# Patient Record
Sex: Female | Born: 1965 | Race: White | Hispanic: No | Marital: Married | Smoking: Never smoker
Health system: Southern US, Community
[De-identification: ages and names within clinical notes are randomized; demographics above are authoritative.]

## PROBLEM LIST (undated history)

## (undated) DIAGNOSIS — Z8719 Personal history of other diseases of the digestive system: Secondary | ICD-10-CM

## (undated) DIAGNOSIS — O24419 Gestational diabetes mellitus in pregnancy, unspecified control: Secondary | ICD-10-CM

## (undated) HISTORY — DX: Personal history of other diseases of the digestive system: Z87.19

## (undated) HISTORY — DX: Gestational diabetes mellitus in pregnancy, unspecified control: O24.419

## (undated) HISTORY — PX: CHOLECYSTECTOMY: SHX55

---

## 2000-10-06 ENCOUNTER — Other Ambulatory Visit: Admission: RE | Admit: 2000-10-06 | Discharge: 2000-10-06 | Payer: Self-pay | Admitting: *Deleted

## 2001-04-18 ENCOUNTER — Other Ambulatory Visit: Admission: RE | Admit: 2001-04-18 | Discharge: 2001-04-18 | Payer: Self-pay | Admitting: *Deleted

## 2001-11-08 ENCOUNTER — Other Ambulatory Visit: Admission: RE | Admit: 2001-11-08 | Discharge: 2001-11-08 | Payer: Self-pay | Admitting: *Deleted

## 2001-12-20 ENCOUNTER — Other Ambulatory Visit: Admission: RE | Admit: 2001-12-20 | Discharge: 2001-12-20 | Payer: Self-pay | Admitting: Obstetrics and Gynecology

## 2002-07-05 ENCOUNTER — Other Ambulatory Visit: Admission: RE | Admit: 2002-07-05 | Discharge: 2002-07-05 | Payer: Self-pay | Admitting: Obstetrics and Gynecology

## 2002-12-26 ENCOUNTER — Other Ambulatory Visit: Admission: RE | Admit: 2002-12-26 | Discharge: 2002-12-26 | Payer: Self-pay | Admitting: Obstetrics and Gynecology

## 2003-05-14 ENCOUNTER — Other Ambulatory Visit: Admission: RE | Admit: 2003-05-14 | Discharge: 2003-05-14 | Payer: Self-pay | Admitting: Obstetrics and Gynecology

## 2003-08-29 ENCOUNTER — Other Ambulatory Visit: Admission: RE | Admit: 2003-08-29 | Discharge: 2003-08-29 | Payer: Self-pay | Admitting: Obstetrics and Gynecology

## 2004-05-05 ENCOUNTER — Other Ambulatory Visit: Admission: RE | Admit: 2004-05-05 | Discharge: 2004-05-05 | Payer: Self-pay | Admitting: Obstetrics and Gynecology

## 2004-08-17 ENCOUNTER — Encounter: Admission: RE | Admit: 2004-08-17 | Discharge: 2004-08-17 | Payer: Self-pay | Admitting: Obstetrics and Gynecology

## 2004-11-04 ENCOUNTER — Other Ambulatory Visit: Admission: RE | Admit: 2004-11-04 | Discharge: 2004-11-04 | Payer: Self-pay | Admitting: Obstetrics and Gynecology

## 2005-01-12 ENCOUNTER — Observation Stay (HOSPITAL_COMMUNITY): Admission: EM | Admit: 2005-01-12 | Discharge: 2005-01-13 | Payer: Self-pay | Admitting: *Deleted

## 2005-02-25 ENCOUNTER — Encounter: Admission: RE | Admit: 2005-02-25 | Discharge: 2005-02-25 | Payer: Self-pay | Admitting: Obstetrics and Gynecology

## 2005-05-05 ENCOUNTER — Other Ambulatory Visit: Admission: RE | Admit: 2005-05-05 | Discharge: 2005-05-05 | Payer: Self-pay | Admitting: Obstetrics and Gynecology

## 2005-08-24 ENCOUNTER — Encounter: Admission: RE | Admit: 2005-08-24 | Discharge: 2005-08-24 | Payer: Self-pay | Admitting: Obstetrics and Gynecology

## 2006-01-11 ENCOUNTER — Other Ambulatory Visit: Admission: RE | Admit: 2006-01-11 | Discharge: 2006-01-11 | Payer: Self-pay | Admitting: Obstetrics and Gynecology

## 2006-02-28 ENCOUNTER — Encounter: Admission: RE | Admit: 2006-02-28 | Discharge: 2006-02-28 | Payer: Self-pay | Admitting: Obstetrics and Gynecology

## 2006-05-17 ENCOUNTER — Other Ambulatory Visit: Admission: RE | Admit: 2006-05-17 | Discharge: 2006-05-17 | Payer: Self-pay | Admitting: Obstetrics and Gynecology

## 2006-09-26 ENCOUNTER — Encounter: Admission: RE | Admit: 2006-09-26 | Discharge: 2006-09-26 | Payer: Self-pay | Admitting: Obstetrics and Gynecology

## 2007-04-20 ENCOUNTER — Encounter: Admission: RE | Admit: 2007-04-20 | Discharge: 2007-04-20 | Payer: Self-pay | Admitting: Obstetrics and Gynecology

## 2007-12-07 ENCOUNTER — Encounter: Admission: RE | Admit: 2007-12-07 | Discharge: 2007-12-07 | Payer: Self-pay | Admitting: Obstetrics and Gynecology

## 2009-12-10 ENCOUNTER — Encounter: Admission: RE | Admit: 2009-12-10 | Discharge: 2009-12-10 | Payer: Self-pay | Admitting: Obstetrics and Gynecology

## 2010-11-22 ENCOUNTER — Encounter: Payer: Self-pay | Admitting: Obstetrics and Gynecology

## 2011-03-19 NOTE — H&P (Signed)
Wolf, Diane                  ACCOUNT NO.:  [id number]   MEDICAL RECORD NO.:  [id number]          PATIENT TYPE:  INP   LOCATION:  A328                          FACILITY:  [facility]   PHYSICIAN:  Ky Barban, M.D.DATE OF BIRTH:  02-13-1966   DATE OF ADMISSION:  01/12/2005  DATE OF DISCHARGE:  LH                                HISTORY & PHYSICAL   CHIEF COMPLAINT:  Recurrent right flank pain.   HISTORY:  This 45 year old female presenting to the emergency room this  morning with severe pain in her right lower quadrant, did have nausea and  vomiting, no fever, chills or any voiding complaint.  CT scan shows 2 mm  stone in the right ureterovesical junction.  Patient was having considerable  pain so she is admitted for control of pain and further management.   PAST HISTORY:  She denies any other significant medical problem.  No  diabetes or hypertension.   ALLERGIES:  IVP DYE.   FAMILY HISTORY:  Father had kidney stones.   PERSONAL HISTORY:  Does not smoke or drink.   REVIEW OF SYSTEMS:  Unremarkable.   EXAMINATION:  Blood pressure 130/80, temperature is normal.  Fully  conscious, alert, oriented and not in acute distress.  Central nervous  system is negative.  Head/neck/eye, ENT:  Negative.  Cardiovascular:  Regular sinus rhythm.  Abdomen is soft/flat, liver/spleen/kidneys are  nonpalpable, deep tenderness right lower quadrant.  Pelvic exam is deferred.  Extremities are normal.   IMPRESSION:  Right ureteral calculus.   PLAN:  Admit.  IV fluids.  Parenteral analgesia.  Strain her urine.      MIJ/MEDQ  D:  01/12/2005  T:  01/12/2005  Job:  [id number]

## 2011-05-06 ENCOUNTER — Other Ambulatory Visit: Payer: Self-pay | Admitting: Obstetrics and Gynecology

## 2011-05-06 DIAGNOSIS — Z1231 Encounter for screening mammogram for malignant neoplasm of breast: Secondary | ICD-10-CM

## 2011-05-13 ENCOUNTER — Ambulatory Visit: Payer: Self-pay

## 2011-05-20 ENCOUNTER — Ambulatory Visit
Admission: RE | Admit: 2011-05-20 | Discharge: 2011-05-20 | Disposition: A | Discharge status: Home / Self Care | Payer: 59 | Source: Ambulatory Visit | Attending: Obstetrics and Gynecology | Admitting: Obstetrics and Gynecology

## 2011-05-20 DIAGNOSIS — Z1231 Encounter for screening mammogram for malignant neoplasm of breast: Secondary | ICD-10-CM

## 2012-04-10 ENCOUNTER — Other Ambulatory Visit: Payer: Self-pay | Admitting: Obstetrics and Gynecology

## 2012-04-10 DIAGNOSIS — Z1231 Encounter for screening mammogram for malignant neoplasm of breast: Secondary | ICD-10-CM

## 2012-05-31 ENCOUNTER — Ambulatory Visit
Admission: RE | Admit: 2012-05-31 | Discharge: 2012-05-31 | Disposition: A | Discharge status: Home / Self Care | Payer: No Typology Code available for payment source | Source: Ambulatory Visit | Attending: Obstetrics and Gynecology | Admitting: Obstetrics and Gynecology

## 2012-05-31 DIAGNOSIS — Z1231 Encounter for screening mammogram for malignant neoplasm of breast: Secondary | ICD-10-CM

## 2013-04-25 ENCOUNTER — Other Ambulatory Visit: Payer: Self-pay

## 2013-04-25 DIAGNOSIS — Z1231 Encounter for screening mammogram for malignant neoplasm of breast: Secondary | ICD-10-CM

## 2013-06-01 ENCOUNTER — Ambulatory Visit: Payer: No Typology Code available for payment source

## 2014-02-05 ENCOUNTER — Ambulatory Visit: Payer: No Typology Code available for payment source

## 2014-02-07 ENCOUNTER — Other Ambulatory Visit: Payer: Self-pay

## 2014-02-07 ENCOUNTER — Ambulatory Visit
Admission: RE | Admit: 2014-02-07 | Discharge: 2014-02-07 | Disposition: A | Discharge status: Home / Self Care | Payer: No Typology Code available for payment source | Source: Ambulatory Visit

## 2014-02-07 DIAGNOSIS — Z1231 Encounter for screening mammogram for malignant neoplasm of breast: Secondary | ICD-10-CM

## 2014-10-20 ENCOUNTER — Emergency Department (HOSPITAL_COMMUNITY)
Admission: EM | Admit: 2014-10-20 | Discharge: 2014-10-20 | Disposition: A | Discharge status: Home / Self Care | Payer: No Typology Code available for payment source | Attending: Emergency Medicine | Admitting: Emergency Medicine

## 2014-10-20 ENCOUNTER — Encounter (HOSPITAL_COMMUNITY): Payer: Self-pay | Admitting: Emergency Medicine

## 2014-10-20 DIAGNOSIS — R55 Syncope and collapse: Secondary | ICD-10-CM | POA: Diagnosis not present

## 2014-10-20 DIAGNOSIS — R61 Generalized hyperhidrosis: Secondary | ICD-10-CM | POA: Insufficient documentation

## 2014-10-20 LAB — I-STAT CHEM 8, ED
BUN: 13 mg/dL (ref 6–23)
Calcium, Ion: 1.21 mmol/L (ref 1.12–1.23)
Chloride: 102 mEq/L (ref 96–112)
Creatinine, Ser: 1 mg/dL (ref 0.50–1.10)
Glucose, Bld: 123 mg/dL — ABNORMAL HIGH (ref 70–99)
HCT: 40 % (ref 36.0–46.0)
Hemoglobin: 13.6 g/dL (ref 12.0–15.0)
Potassium: 3.2 mEq/L — ABNORMAL LOW (ref 3.7–5.3)
Sodium: 140 mEq/L (ref 137–147)
TCO2: 23 mmol/L (ref 0–100)

## 2014-10-20 NOTE — ED Notes (Signed)
Discharge instructions given, pt demonstrated teach back and verbal understanding. No concerns voiced.  

## 2014-10-20 NOTE — Discharge Instructions (Signed)
Drink plenty of fluids.  Recheck as needed.

## 2014-10-20 NOTE — ED Notes (Signed)
Patient was visiting with mother in ED and had syncopal episode.  Patient states she began to feel hot and sweaty and syncope was witnessed by ED tech.  Patient states she feels tingly all over.

## 2014-10-20 NOTE — ED Provider Notes (Signed)
CSN: [id number]     Arrival date & time 10/20/14  0006 History  This chart was scribed for Ward GivensIva L Jasmine Mcbeth, MD by Roxy Cedarhandni Bhalodia, ED Scribe. This patient was seen in room APA06/APA06 and the patient's care was started at 12:35 AM.   Chief Complaint  Patient presents with  . Near Syncope   Patient is a 48 y.o. female presenting with near-syncope. The history is provided by the patient. No language interpreter was used.  Near Syncope Associated symptoms include headaches.   HPI Comments: Diane Wolf is a 48 y.o. female with a history of heart murmur, cholecystectomy, who presents to the Emergency Department complaining of near syncope episode with associated diaphoresis and nausea. Patient was here for her mother who was in [facility] ED being seen for MVC. She states that she was texting her sister after standing for a while and having her head bent over when she had sudden onset of diaphoresis, nausea, and headache. She felt like she was going to have a syncopal episode. Per nurse tech in the room with her she did have a syncopal spell and was placed in a chair. No fall or trauma.  She was feeling weak and shaky, with tingling in her toes. She does not feel like she is going to have a syncopal episode anymore. She denies associated chest pain. She states she has previously had a syncopal episode over 20 years ago. She states she feels lightheaded whenever she leans over or squats for most of her adult life. She states all the females in her family have those symptoms. She states both she and her mother have heart murmurs that are not significant.  She states she saw a cardiologist about 15 years ago when she was having palpitations and she was told it was something benign. She was told she could be treated or she could not take medication.  PCP Dr Leandrew KoyanagiBurdine  History reviewed. No pertinent past medical history. Past Surgical History  Procedure Laterality Date  . Cholecystectomy     No family history  on file. History  Substance Use Topics  . Smoking status: Never Smoker   . Smokeless tobacco: Not on file  . Alcohol Use: No   OB History    No data available     Review of Systems  Constitutional: Positive for diaphoresis.  Cardiovascular: Positive for near-syncope.  Neurological: Positive for syncope (near syncope) and headaches.  All other systems reviewed and are negative.  Allergies  Contrast media  Home Medications   Prior to Admission medications   Not on File   Triage Vitals: BP 118/89 mmHg  Pulse 74  Temp(Src) 97.8 F (36.6 C) (Oral)  Ht 5\' 5"  (1.651 m)  Wt 150 lb (68.04 kg)  BMI 24.96 kg/m2  SpO2 100%  LMP 01/11/2011  Physical Exam  Constitutional: She is oriented to person, place, and time. She appears well-developed and well-nourished.  Non-toxic appearance. She does not appear ill. No distress.  HENT:  Head: Normocephalic and atraumatic.  Right Ear: External ear normal.  Left Ear: External ear normal.  Nose: Nose normal. No mucosal edema or rhinorrhea.  Mouth/Throat: Oropharynx is clear and moist and mucous membranes are normal. No dental abscesses or uvula swelling.  Eyes: Conjunctivae and EOM are normal. Pupils are equal, round, and reactive to light.  Neck: Normal range of motion and full passive range of motion without pain. Neck supple.  Cardiovascular: Normal rate, regular rhythm and normal heart sounds.  Exam  reveals no gallop and no friction rub.   No murmur heard. Pulmonary/Chest: Effort normal and breath sounds normal. No respiratory distress. She has no wheezes. She has no rhonchi. She has no rales. She exhibits no tenderness and no crepitus.  Abdominal: Soft. Normal appearance and bowel sounds are normal. She exhibits no distension. There is no tenderness. There is no rebound and no guarding.  Musculoskeletal: Normal range of motion. She exhibits no edema or tenderness.  Moves all extremities well.   Neurological: She is alert and oriented  to person, place, and time. She has normal strength. No cranial nerve deficit.  Skin: Skin is warm, dry and intact. No rash noted. No erythema. No pallor.  Psychiatric: She has a normal mood and affect. Her speech is normal and behavior is normal. Her mood appears not anxious.  Nursing note and vitals reviewed.  ED Course  Procedures (including critical care time)  Medications - No data to display  DIAGNOSTIC STUDIES: Oxygen Saturation is 100% on RA, normal by my interpretation.    COORDINATION OF CARE: 12:39 AM- Discussed plans to order diagnostic EKG and lab work. Pt advised of plan for treatment and pt agrees.  Patient was given oral fluids and she felt improved. Patient was told her potassium was low and to eat more foods high in potassium.   Orthostatic Vital Signs Orthostatic Lying  - BP- Lying: 106/42 mmHg ; Pulse- Lying: 76  Orthostatic Sitting - BP- Sitting: 116/68 mmHg ; Pulse- Sitting: 70  Orthostatic Standing at 0 minutes - BP- Standing at 0 minutes: 103/61 mmHg      Labs Review Results for orders placed or performed during the hospital encounter of 10/20/14  I-stat Chem 8, ED  Result Value Ref Range   Sodium 140 137 - 147 mEq/L   Potassium 3.2 (L) 3.7 - 5.3 mEq/L   Chloride 102 96 - 112 mEq/L   BUN 13 6 - 23 mg/dL   Creatinine, Ser 9.521.00 0.50 - 1.10 mg/dL   Glucose, Bld 841123 (H) 70 - 99 mg/dL   Calcium, Ion 3.241.21 4.011.12 - 1.23 mmol/L   TCO2 23 0 - 100 mmol/L   Hemoglobin 13.6 12.0 - 15.0 g/dL   HCT 02.740.0 25.336.0 - 66.446.0 %    Laboratory interpretation all normal except hypokalemia    Imaging Review No results found.   EKG Interpretation   Date/Time:  Sunday October 20 2014 00:13:02 EST Ventricular Rate:  76 PR Interval:  161 QRS Duration: 92 QT Interval:  403 QTC Calculation: 453 R Axis:   10 Text Interpretation:  Sinus rhythm Baseline wander Otherwise within normal  limits No old tracing to compare Confirmed by Donie Moulton  MD-I, Santana Edell ([id number]) on  10/20/2014  1:51:44 AM     MDM   Final diagnoses:  Vasovagal syncope    Plan discharge  Devoria AlbeIva Tambi Thole, MD, FACEP   I personally performed the services described in this documentation, which was scribed in my presence. The recorded information has been reviewed and considered.  Devoria AlbeIva Rakisha Pincock, MD, FACEP    Ward GivensIva L Seher Schlagel, MD 10/20/14 [phone]

## 2016-04-29 ENCOUNTER — Other Ambulatory Visit: Payer: Self-pay | Admitting: Obstetrics and Gynecology

## 2016-04-29 DIAGNOSIS — Z1231 Encounter for screening mammogram for malignant neoplasm of breast: Secondary | ICD-10-CM

## 2016-05-18 ENCOUNTER — Ambulatory Visit
Admission: RE | Admit: 2016-05-18 | Discharge: 2016-05-18 | Disposition: A | Discharge status: Home / Self Care | Payer: 59 | Source: Ambulatory Visit | Attending: Obstetrics and Gynecology | Admitting: Obstetrics and Gynecology

## 2016-05-18 DIAGNOSIS — Z1231 Encounter for screening mammogram for malignant neoplasm of breast: Secondary | ICD-10-CM

## 2017-04-28 ENCOUNTER — Other Ambulatory Visit: Payer: Self-pay | Admitting: Obstetrics and Gynecology

## 2017-04-28 DIAGNOSIS — Z1231 Encounter for screening mammogram for malignant neoplasm of breast: Secondary | ICD-10-CM

## 2017-05-19 ENCOUNTER — Ambulatory Visit
Admission: RE | Admit: 2017-05-19 | Discharge: 2017-05-19 | Disposition: A | Discharge status: Home / Self Care | Payer: 59 | Source: Ambulatory Visit | Attending: Obstetrics and Gynecology | Admitting: Obstetrics and Gynecology

## 2017-05-19 DIAGNOSIS — Z1231 Encounter for screening mammogram for malignant neoplasm of breast: Secondary | ICD-10-CM

## 2017-09-16 ENCOUNTER — Encounter (HOSPITAL_COMMUNITY): Payer: Self-pay | Admitting: Physical Therapy

## 2017-09-16 ENCOUNTER — Other Ambulatory Visit: Payer: Self-pay

## 2017-09-16 ENCOUNTER — Ambulatory Visit (HOSPITAL_COMMUNITY): Payer: Worker's Compensation | Attending: Specialist | Admitting: Physical Therapy

## 2017-09-16 DIAGNOSIS — M25511 Pain in right shoulder: Secondary | ICD-10-CM

## 2017-09-16 DIAGNOSIS — M25611 Stiffness of right shoulder, not elsewhere classified: Secondary | ICD-10-CM | POA: Insufficient documentation

## 2017-09-16 NOTE — Therapy (Signed)
[facility] [address] [place], [place], [address] Phone: [phone]   Fax:  [phone]  Physical Therapy Evaluation  Patient Details  Name: Diane Wolf MRN: [id number] Date of Birth: [birth year] Referring Provider: Eugenia Wolf   Encounter Date: 09/16/2017  PT End of Session - 09/16/17 1610    Visit Number  1    Number of Visits  12    Date for PT Re-Evaluation  10/16/17    Authorization Type  UHC    Authorization - Visit Number  1    Authorization - Number of Visits  10    PT Start Time  1525    PT Stop Time  1605    PT Time Calculation (min)  40 min    Activity Tolerance  Patient tolerated treatment well    Behavior During Therapy  [facility] for tasks assessed/performed       History reviewed. No pertinent past medical history.  Past Surgical History:  Procedure Laterality Date  . CHOLECYSTECTOMY      There were no vitals filed for this visit.   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  She had chiropractic treatment which helped, however, recently, a ball was thrown at her and when she quickly lifted her arm up to protect herself from getting hit she had an acute episode of pain.  She went  to the MD who was given predinsone and a cortisone shot which has improved her pain approximately 80% better.  She is no referred her to physical therapy.    Limitations  Lifting;House hold activities    Patient Stated Goals  no pain with activity     Currently in Pain?  No/denies 7/10 is the worst     Pain Score  0-No pain    Pain Location  Shoulder    Pain Orientation  Left    Pain Descriptors / Indicators  Aching;Sharp;Restless    Pain Type  Acute pain;Chronic pain    Pain Onset  More than a month ago    Pain Frequency  Intermittent    Aggravating Factors   lifting    Pain Relieving  Factors  rest          [facility] PT Assessment - 09/16/17 0001      Assessment   Medical Diagnosis  Rt shoulder impingement    Referring Provider  Diane Wolf    Onset Date/Surgical Date  01/13/17    Next MD Visit  10/13/2017    Prior Therapy  none      Precautions   Precautions  None      Restrictions   Weight Bearing Restrictions  No      Balance Screen   Has the patient fallen in the past 6 months  No    Has the patient had a decrease in activity level because of a fear of falling?   No    Is the patient reluctant to leave their home because of a fear of falling?   No      Prior Function   Level of Independence  Independent      Cognition   Overall Cognitive Status  Within Functional Limits for tasks assessed      Observation/Other Assessments   Focus on Therapeutic Outcomes (FOTO)   60  ROM / Strength   AROM / PROM / Strength  AROM;Strength      AROM   Overall AROM Comments  supine     AROM Assessment Site  Shoulder    Right/Left Shoulder  Right    Right Shoulder Flexion  120 Degrees    Right Shoulder ABduction  60 Degrees    Right Shoulder Internal Rotation  22 Degrees    Right Shoulder External Rotation  18 Degrees      Strength   Strength Assessment Site  Shoulder    Right/Left Shoulder  Right    Right Shoulder Flexion  4/5    Right Shoulder Extension  4-/5    Right Shoulder ABduction  4/5    Right Shoulder Internal Rotation  4+/5    Right Shoulder External Rotation  3+/5    Right Shoulder Horizontal ABduction  5/5    Right Shoulder Horizontal ADduction  5/5             Objective measurements completed on examination: See above findings.      [facility] Adult PT Treatment/Exercise - 09/16/17 0001      Exercises   Exercises  Shoulder      Shoulder Exercises: Supine   External Rotation  Right;10 reps    External Rotation Weight (lbs)  1    Internal Rotation  10 reps    Internal Rotation Weight (lbs)  1    Flexion  AAROM;Both;10 reps     ABduction  AAROM;Right;10 reps      Manual Therapy   Manual Therapy  Joint mobilization;Passive ROM    Manual therapy comments  done seperate from all other aspects of treatment     Joint Mobilization  ant, posterior and inferior glides     Passive ROM  all              PT Education - 09/16/17 1610    Education provided  Yes    Education Details  HEP    Person(s) Educated  Patient    Methods  Explanation;Handout       PT Short Term Goals - 09/16/17 1619      PT SHORT TERM GOAL #1   Title  Pt Rt UE motion to improve to allow pt to easily wash and style her hair.    Time  3    Period  Weeks    Status  New    Target Date  10/07/17      PT SHORT TERM GOAL #2   Title  PT pain to be no greater than a 4/10 to allow pt to have a full night sleep     Time  3    Period  Weeks    Status  New      PT SHORT TERM GOAL #3   Title  Pt to be able to reach up to her seatbelt and buckle it without difficulty     Time  3    Period  Weeks    Status  New      PT SHORT TERM GOAL #4   Title  Pt to be completing HEP in order to increase the motion of her right arm to allow the above to occur.        PT Long Term Goals - 09/16/17 1621      PT LONG TERM GOAL #1   Title  Pt Rt shoulder  ROM to have improved to allow pt to reach into her back pocket  without difficulty     Time  6    Period  Weeks    Status  New    Target Date  10/28/17      PT LONG TERM GOAL #2   Title  Pt Rt shoulder  ROM to have improved to allow pt to clasp her bra without difficulty     Time  6    Period  Weeks    Status  New      PT LONG TERM GOAL #3   Title  PT Rt shoulder ROM and strength to be increased to where pt can put her dishes up into higher cabinets without difficulty     Time  6    Period  Weeks    Status  New      PT LONG TERM GOAL #4   Title  Pt pain to be no greater than a 1/10 throughout the day 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     Time  6     Period  Weeks    Status  New             Plan - 09/16/17 1611    Clinical Impression Statement  Diane Wolf is a 51 yo female who has had two injuries to her Rt shoulder in the past seven months causing an impingement with adhesive capsulitis.  She has had an injection and been placed on prednisone which has imporved her pain significantly.  She is now being referred to skilled physical therapy to gain her ROM and strength.  Evaluation demonstrates significant weakness in the external rotators with mild weakness in all other mm.  All motions are limited significantly with joint capsule adhesion.  Diane Wolf will benefit from skilled physical therapy for exercises, PROM,  and joint mobilization.    Clinical Presentation  Stable    Clinical Decision Making  Low    Rehab Potential  Good    PT Frequency  2x / week    PT Duration  6 weeks    PT Treatment/Interventions  ADLs/Self Care Home Management;Cryotherapy;Ultrasound;Iontophoresis 4mg /ml Dexamethasone;Therapeutic activities;Therapeutic exercise;Patient/family education;Manual techniques;Passive range of motion    PT Next Visit Plan  begin pulley exercises, table flexion and abduction, wall walking for flexion, abduction and ER.  Continue with PROM and joint mobilizations.      PT Home Exercise Plan  eval: wand flexion, abduction, IR/ER.  Self joint capsule stretch for posterior, inferior and anterior capsule.        Patient will benefit from skilled therapeutic intervention in order to improve the following deficits and impairments:  Decreased activity tolerance, Decreased range of motion, Decreased strength, Impaired flexibility, Impaired UE functional use, Pain  Visit Diagnosis: Stiffness of right shoulder, not elsewhere classified - Plan: PT plan of care cert/re-cert  Acute pain of right shoulder - Plan: PT plan of care cert/re-cert     Problem List There are no active problems to display for this patient. Virgina OrganCynthia Debar Plate, PT  CLT [phone] 09/16/2017, 4:29 PM  [facility] [address] [place], [place], [address] Phone: [phone]   Fax:  [phone]  Name: Diane Wolf MRN: [id number] Date of Birth: [birth year]

## 2017-09-16 NOTE — Patient Instructions (Addendum)
ROM: Flexion - Wand (Supine)    Lie on back holding wand. Raise arms over head.  Repeat _10___ times per set. Do 1____ sets per session. Do _2__ sessions per day.  http://orth.exer.us/928   Copyright  VHI. All rights reserved.  ROM: Abduction - Wand    Holding wand with right  hand palm up, push wand directly out to side, leading with other hand palm up, until stretch is felt. Hold _5-10___ seconds. Repeat _10___ times per set. Do _1___ sets per session. Do _2___ sessions per day.  http://orth.exer.us/746   Copyright  VHI. All rights reserved.  ROM: External Rotation - Wand (Supine)    Lie on back holding a can with elbows bent to 90. Rotate forearms over head as far as possible. Then back to your hip. (pillow to hip ) Repeat __10__ times per set. Do __1__ sets per session. Do 2____ sessions per day.  http://orth.exer.us/932   Copyright  VHI. All rights reserved.  ROM: Posterior Capsule Stretch    Gently pull on right  forward elbow with other hand until stretch is felt in shoulder. Hold _15___ seconds. Repeat _3___ times per set. Do __1__ sets per session. Do __2__ sessions per day.  http://orth.exer.us/886   Copyright  VHI. All rights reserved.  ROM: Anterior Glide    Lean body weight between arms until stretch is felt. Hold __60__ seconds. Repeat __1__ times per set. Do ___1_ sets per session. Do ___2_ sessions per day.  http://orth.exer.us/770   Copyright  VHI. All rights reserved.  ROM: Inferior Glide    With towel under right  arm, gently pull arm toward floor until stretch is felt. Hold 30___ seconds. Repeat _5___ times per set. Do ___1_ sets per session. Do ___2_ sessions per day.  http://orth.exer.us/774   Copyright  VHI. All rights reserved.

## 2017-09-21 ENCOUNTER — Encounter (HOSPITAL_COMMUNITY): Payer: Self-pay

## 2017-09-21 ENCOUNTER — Ambulatory Visit (HOSPITAL_COMMUNITY): Payer: Worker's Compensation

## 2017-09-21 DIAGNOSIS — M25611 Stiffness of right shoulder, not elsewhere classified: Secondary | ICD-10-CM

## 2017-09-21 DIAGNOSIS — M25511 Pain in right shoulder: Secondary | ICD-10-CM

## 2017-09-21 NOTE — Therapy (Addendum)
[facility] [address] [place], [place], [address] Phone: [phone]   Fax:  [phone]  Physical Therapy Treatment  Patient Details  Name: Diane Wolf MRN: [id number] Date of Birth: [birth year] Referring Provider: Eugenia Mcalpineobert Collins   Encounter Date: 09/21/2017  PT End of Session - 09/21/17 1441    Visit Number  2    Number of Visits  12    Date for PT Re-Evaluation  10/16/17    Authorization Type  UHC    Authorization - Visit Number  2    Authorization - Number of Visits  10    PT Start Time  1432    PT Stop Time  1511    PT Time Calculation (min)  39 min    Activity Tolerance  Patient tolerated treatment well;No increased pain    Behavior During Therapy  [facility] for tasks assessed/performed       History reviewed. No pertinent past medical history.  Past Surgical History:  Procedure Laterality Date  . CHOLECYSTECTOMY      There were no vitals filed for this visit.  Subjective Assessment - 09/21/17 1436    Subjective  Pt stated she is feeling good today, not in any pain.  Reports compliance with HEP with minimal questions with form    Patient Stated Goals  no pain with activity     Currently in Pain?  No/denies           [facility] Adult PT Treatment/Exercise - 09/21/17 0001      Shoulder Exercises: Supine   External Rotation  Right;10 reps    External Rotation Weight (lbs)  wand    Internal Rotation  10 reps    Internal Rotation Weight (lbs)  wand    Flexion  AAROM;Both;10 reps wand    ABduction  AAROM;Right;10 reps wand      Shoulder Exercises: Seated   External Rotation  10 reps wand    Flexion  10 reps table slides    Abduction  10 reps table slides with 5" holds at end range      Shoulder Exercises: Pulleys   Flexion  2 minutes cueing for mechanics and form    ABduction  2 minutes cueing for mechanics and form         prone on elbow  PROM all directions and joint mobs ant, posterior and inferior  glides       PT Short Term Goals - 09/16/17 1619      PT SHORT TERM GOAL #1   Title  Pt Rt UE motion to improve to allow pt to easily wash and style her hair.    Time  3    Period  Weeks    Status  New    Target Date  10/07/17      PT SHORT TERM GOAL #2   Title  PT pain to be no greater than a 4/10 to allow pt to have a full night sleep     Time  3    Period  Weeks    Status  New      PT SHORT TERM GOAL #3   Title  Pt to be able to reach up to her seatbelt and buckle it without difficulty     Time  3    Period  Weeks    Status  New      PT SHORT TERM GOAL #4   Title  Pt to be completing HEP in  order to increase the motion of her right arm to allow the above to occur.        PT Long Term Goals - 09/16/17 1621      PT LONG TERM GOAL #1   Title  Pt Rt shoulder  ROM to have improved to allow pt to reach into her back pocket without difficulty     Time  6    Period  Weeks    Status  New    Target Date  10/28/17      PT LONG TERM GOAL #2   Title  Pt Rt shoulder  ROM to have improved to allow pt to clasp her bra without difficulty     Time  6    Period  Weeks    Status  New      PT LONG TERM GOAL #3   Title  PT Rt shoulder ROM and strength to be increased to where pt can put her dishes up into higher cabinets without difficulty     Time  6    Period  Weeks    Status  New      PT LONG TERM GOAL #4   Title  Pt pain to be no greater than a 1/10 throughout the day 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     Time  6    Period  Weeks    Status  New            Plan - 09/21/17 1515    Clinical Impression Statement  Reviewed goals, assured compliance and proper form with HEP and copy of eval given to pt.  Session focus on shoulder mobility with additonal exercises to address mobility.  Therapist facilitation with cueing to reduce compensation with side bending and shoulder elevation wiht fleixon and abduction.  No reports of pain  through session.      Rehab Potential  Good    PT Frequency  2x / week    PT Duration  6 weeks    PT Treatment/Interventions  ADLs/Self Care Home Management;Cryotherapy;Ultrasound;Iontophoresis 4mg /ml Dexamethasone;Therapeutic activities;Therapeutic exercise;Patient/family education;Manual techniques;Passive range of motion    PT Next Visit Plan  Add ball exercises for shoulder mobiltiy.  Continues wiht pullies, table slides and wall walking.  Continue with PROM and joint mobilizations.      PT Home Exercise Plan  eval: wand flexion, abduction, IR/ER.  Self joint capsule stretch for posterior, inferior and anterior capsule.        Patient will benefit from skilled therapeutic intervention in order to improve the following deficits and impairments:  Decreased activity tolerance, Decreased range of motion, Decreased strength, Impaired flexibility, Impaired UE functional use, Pain  Visit Diagnosis: Stiffness of right shoulder, not elsewhere classified  Acute pain of right shoulder     Problem List There are no active problems to display for this patient.  [address]Kynslee Baham, LPTA; CBIS [phone]  Juel BurrowCockerham, Andraya Frigon Jo 09/21/2017, 3:18 PM  [facility] [address] , [place], [address] Phone: [phone]   Fax:  [phone]  Name: Diane Wolf MRN: [id number] Date of Birth: 06/25/1966

## 2017-09-27 ENCOUNTER — Encounter (HOSPITAL_COMMUNITY): Payer: Self-pay

## 2017-09-27 ENCOUNTER — Ambulatory Visit (HOSPITAL_COMMUNITY): Payer: Worker's Compensation

## 2017-09-27 DIAGNOSIS — M25611 Stiffness of right shoulder, not elsewhere classified: Secondary | ICD-10-CM | POA: Diagnosis not present

## 2017-09-27 DIAGNOSIS — M25511 Pain in right shoulder: Secondary | ICD-10-CM

## 2017-09-27 NOTE — Therapy (Signed)
[facility] [address] [place], [place], [address] Phone: [phone]   Fax:  [phone]  Physical Therapy Treatment  Patient Details  Name: Diane Wolf MRN: [id number] Date of Birth: Sep 14, 1966 Referring Provider: Eugenia Mcalpineobert Collins   Encounter Date: 09/27/2017  PT End of Session - 09/27/17 1526    Visit Number  3    Number of Visits  12    Date for PT Re-Evaluation  10/16/17    Authorization Type  UHC    Authorization - Visit Number  3    Authorization - Number of Visits  10    PT Start Time  1517    PT Stop Time  1603    PT Time Calculation (min)  46 min    Activity Tolerance  Patient tolerated treatment well;No increased pain    Behavior During Therapy  [facility]       History reviewed. No pertinent past medical history.  Past Surgical History:  Procedure Laterality Date  . CHOLECYSTECTOMY      There were no vitals filed for this visit.  Subjective Assessment - 09/27/17 1522    Subjective  Pt stated her shoulder is stiff today.  Reports she has been compliant with HEP without questions.  Pt stated she has back pain with shoulder movements,. received massage yesterday.      Patient Stated Goals  no pain with activity     Currently in Pain?  Yes    Pain Score  2     Pain Location  Shoulder    Pain Orientation  Right    Pain Descriptors / Indicators  Tightness    Pain Type  Acute pain;Chronic pain    Pain Onset  More than a month ago    Pain Frequency  Intermittent    Aggravating Factors   lifting    Pain Relieving Factors  rest                      [facility] Adult PT Treatment/Exercise - 09/27/17 0001      Shoulder Exercises: Seated   External Rotation  10 reps    Flexion  10 reps table slides    Abduction  10 reps table slides      Shoulder Exercises: Pulleys   Flexion  2 minutes cueing for posture, mechanics and form    ABduction  2 minutes cueing for posture, mechanics and  form      Shoulder Exercises: Therapy Ball   Flexion  10 reps    ABduction  10 reps    Right/Left  10 reps      Shoulder Exercises: Stretch   Other Shoulder Stretches  childs pose 2x 30"    Other Shoulder Stretches  POE x 2min      Manual Therapy   Manual Therapy  Joint mobilization;Passive ROM    Manual therapy comments  done seperate from all other aspects of treatment     Joint Mobilization  ant, posterior and inferior glides     Passive ROM  all                PT Short Term Goals - 09/16/17 1619      PT SHORT TERM GOAL #1   Title  Pt Rt UE motion to improve to allow pt to easily wash and style her hair.    Time  3    Period  Weeks    Status  New    Target Date  10/07/17      PT SHORT TERM GOAL #2   Title  PT pain to be no greater than a 4/10 to allow pt to have a full night sleep     Time  3    Period  Weeks    Status  New      PT SHORT TERM GOAL #3   Title  Pt to be able to reach up to her seatbelt and buckle it without difficulty     Time  3    Period  Weeks    Status  New      PT SHORT TERM GOAL #4   Title  Pt to be completing HEP in order to increase the motion of her right arm to allow the above to occur.        PT Long Term Goals - 09/16/17 1621      PT LONG TERM GOAL #1   Title  Pt Rt shoulder  ROM to have improved to allow pt to reach into her back pocket without difficulty     Time  6    Period  Weeks    Status  New    Target Date  10/28/17      PT LONG TERM GOAL #2   Title  Pt Rt shoulder  ROM to have improved to allow pt to clasp her bra without difficulty     Time  6    Period  Weeks    Status  New      PT LONG TERM GOAL #3   Title  PT Rt shoulder ROM and strength to be increased to where pt can put her dishes up into higher cabinets without difficulty     Time  6    Period  Weeks    Status  New      PT LONG TERM GOAL #4   Title  Pt pain to be no greater than a 1/10 throughout the day 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     Time  6    Period  Weeks    Status  New            Plan - 09/27/17 1749    Clinical Impression Statement  Session focus on shoulder mobility.  Added theraband for flexion, abduction and IR/ER and child's pose for flexion and to address tightness in back for pain control.  Manual PROM complete per pt tolerance and joint mobs to improve mobilty.  Positive results with distractions during PROM.  No reports of increased pain through session.      Rehab Potential  Good    PT Frequency  2x / week    PT Duration  6 weeks    PT Treatment/Interventions  ADLs/Self Care Home Management;Cryotherapy;Ultrasound;Iontophoresis 4mg /ml Dexamethasone;Therapeutic activities;Therapeutic exercise;Patient/family education;Manual techniques;Passive range of motion    PT Next Visit Plan  Primarly shoulder mobiltiy with therex and manual PROM/joint mobs.      PT Home Exercise Plan  eval: wand flexion, abduction, IR/ER.  Self joint capsule stretch for posterior, inferior and anterior capsule.        Patient will benefit from skilled therapeutic intervention in order to improve the following deficits and impairments:  Decreased activity tolerance, Decreased range of motion, Decreased strength, Impaired flexibility, Impaired UE functional use, Pain  Visit Diagnosis: Stiffness of right shoulder, not elsewhere classified  Acute pain of right shoulder  Problem List There are no active problems to display for this patient.  [address]Shreyan Hinz, LPTA; CBIS [phone].  Juel BurrowCockerham, Mumin Denomme Jo 09/27/2017, 5:54 PM  [facility] [address] [place], [place], [address] Phone: [phone]   Fax:  [phone]  Name: Diane Wolf MRN: [id number] Date of Birth: 1966/05/06

## 2017-09-27 NOTE — Patient Instructions (Signed)
BACK: Child's Pose (Sciatica)    Sit in knee-chest position and reach arms forward. Separate knees for comfort. Hold position for 30 breaths. Repeat 3 times. Do 2 times per day.  Copyright  VHI. All rights reserved.   

## 2017-09-29 ENCOUNTER — Ambulatory Visit (HOSPITAL_COMMUNITY): Payer: Worker's Compensation | Admitting: Physical Therapy

## 2017-09-29 DIAGNOSIS — M25611 Stiffness of right shoulder, not elsewhere classified: Secondary | ICD-10-CM | POA: Diagnosis not present

## 2017-09-29 DIAGNOSIS — M25511 Pain in right shoulder: Secondary | ICD-10-CM

## 2017-09-29 NOTE — Therapy (Signed)
[facility] [address] [place], [place], [address] Phone: [phone]   Fax:  [phone]  Physical Therapy Treatment  Patient Details  Name: Diane Wolf MRN: [id number] Date of Birth: 1966/03/08 Referring Provider: Eugenia Mcalpine   Encounter Date: 09/29/2017  PT End of Session - 09/29/17 1709    Visit Number  4    Number of Visits  12    Date for PT Re-Evaluation  10/16/17    Authorization Type  UHC    Authorization - Visit Number  4    Authorization - Number of Visits  10    PT Start Time  1608    PT Stop Time  1700    PT Time Calculation (min)  52 min    Activity Tolerance  Patient tolerated treatment well;No increased pain    Behavior During Therapy  [facility] for tasks assessed/performed       No past medical history on file.  Past Surgical History:  Procedure Laterality Date  . CHOLECYSTECTOMY      There were no vitals filed for this visit.  Subjective Assessment - 09/29/17 1611    Subjective  PT states she aggrevated her shoulder yesterday when she she was at [employer] and an item was slipping out of her buggy causing her to reach quickly for it.  STates she had difficulty sleeping last night due to discomfort.  Currently it does not hurt today.    Currently in Pain?  No/denies         [facility] PT Assessment - 09/29/17 0001      Assessment   Medical Diagnosis  Rt shoulder impingement      AROM   Overall AROM Comments  supine     AROM Assessment Site  Shoulder    Right/Left Shoulder  Right    Right Shoulder Flexion  133 Degrees was 120    Right Shoulder ABduction  90 Degrees was 60    Right Shoulder Internal Rotation  40 Degrees was 22    Right Shoulder External Rotation  30 Degrees was 18                  [facility] Adult PT Treatment/Exercise - 09/29/17 0001      Shoulder Exercises: Supine   External Rotation  PROM    Internal Rotation  PROM    Flexion  PROM    ABduction  PROM      Shoulder  Exercises: Seated   External Rotation  10 reps    Flexion  10 reps    Abduction  10 reps      Shoulder Exercises: Standing   Other Standing Exercises  corner stretch 2X20"    Other Standing Exercises  GTB retraction 15 reps      Shoulder Exercises: Pulleys   Flexion  2 minutes    ABduction  2 minutes      Shoulder Exercises: Therapy Ball   Flexion  10 reps    ABduction  10 reps    Right/Left  10 reps      Shoulder Exercises: ROM/Strengthening   UBE (Upper Arm Bike)  3 minutes backward L1      Manual Therapy   Manual Therapy  Joint mobilization;Passive ROM    Manual therapy comments  done seperate from all other aspects of treatment     Joint Mobilization  ant, posterior and inferior glides     Passive ROM  all  PT Short Term Goals - 09/16/17 1619      PT SHORT TERM GOAL #1   Title  Pt Rt UE motion to improve to allow pt to easily wash and style her hair.    Time  3    Period  Weeks    Status  New    Target Date  10/07/17      PT SHORT TERM GOAL #2   Title  PT pain to be no greater than a 4/10 to allow pt to have a full night sleep     Time  3    Period  Weeks    Status  New      PT SHORT TERM GOAL #3   Title  Pt to be able to reach up to her seatbelt and buckle it without difficulty     Time  3    Period  Weeks    Status  New      PT SHORT TERM GOAL #4   Title  Pt to be completing HEP in order to increase the motion of her right arm to allow the above to occur.        PT Long Term Goals - 09/16/17 1621      PT LONG TERM GOAL #1   Title  Pt Rt shoulder  ROM to have improved to allow pt to reach into her back pocket without difficulty     Time  6    Period  Weeks    Status  New    Target Date  10/28/17      PT LONG TERM GOAL #2   Title  Pt Rt shoulder  ROM to have improved to allow pt to clasp her bra without difficulty     Time  6    Period  Weeks    Status  New      PT LONG TERM GOAL #3   Title  PT Rt shoulder ROM and  strength to be increased to where pt can put her dishes up into higher cabinets without difficulty     Time  6    Period  Weeks    Status  New      PT LONG TERM GOAL #4   Title  Pt pain to be no greater than a 1/10 throughout the day 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     Time  6    Period  Weeks    Status  New            Plan - 09/29/17 1709    Clinical Impression Statement  Continued with primary focus on shoulder ROM. Noted improvement as compared to last measurements on 11/16 in all planes of motion 12-30 degrees.  NOted tightness in upper trap region, which pateint reports she is going to get a massage next week.  Added scapular strengthening with theraband and instructed with corner pec stretch and ER doorway stretch.  Pt reported some soreness at EOS but still painfree.      Rehab Potential  Good    PT Frequency  2x / week    PT Duration  6 weeks    PT Treatment/Interventions  ADLs/Self Care Home Management;Cryotherapy;Ultrasound;Iontophoresis 4mg /ml Dexamethasone;Therapeutic activities;Therapeutic exercise;Patient/family education;Manual techniques;Passive range of motion    PT Next Visit Plan  Primarly shoulder mobiltiy with therex and manual PROM/joint mobs.   Educate on posture/postural strengthening.      PT Home Exercise Plan  eval: wand flexion, abduction, IR/ER.  Self joint capsule stretch for posterior, inferior and anterior capsule.        Patient will benefit from skilled therapeutic intervention in order to improve the following deficits and impairments:  Decreased activity tolerance, Decreased range of motion, Decreased strength, Impaired flexibility, Impaired UE functional use, Pain  Visit Diagnosis: Stiffness of right shoulder, not elsewhere classified  Acute pain of right shoulder     Problem List There are no active problems to display for this patient.  Lurena Nidamy B Hurshel Bouillon, PTA/CLT [phone]  Lurena NidaFrazier, Harvest  B 09/29/2017, 5:14 PM  [facility] [address] [place], [place], [address] Phone: [phone]   Fax:  [phone]  Name: Diane Wolf MRN: [id number] Date of Birth: [birth year]

## 2017-10-04 ENCOUNTER — Ambulatory Visit (HOSPITAL_COMMUNITY): Payer: Worker's Compensation | Attending: Specialist | Admitting: Physical Therapy

## 2017-10-04 DIAGNOSIS — M25511 Pain in right shoulder: Secondary | ICD-10-CM

## 2017-10-04 DIAGNOSIS — M25611 Stiffness of right shoulder, not elsewhere classified: Secondary | ICD-10-CM | POA: Insufficient documentation

## 2017-10-04 NOTE — Therapy (Signed)
[facility] [address] [place], [place], [address] Phone: [phone]   Fax:  [phone]  Physical Therapy Treatment  Patient Details  Name: Diane Wolf MRN: [id number] Date of Birth: 02/02/1966 Referring Provider: Eugenia Mcalpine   Encounter Date: 10/04/2017  PT End of Session - 10/04/17 1715    Visit Number  5    Number of Visits  12    Date for PT Re-Evaluation  10/16/17    Authorization Type  UHC    Authorization - Visit Number  5    Authorization - Number of Visits  10    PT Start Time  1605    PT Stop Time  1655    PT Time Calculation (min)  50 min    Activity Tolerance  Patient tolerated treatment well;No increased pain    Behavior During Therapy  [facility] for tasks assessed/performed       No past medical history on file.  Past Surgical History:  Procedure Laterality Date  . CHOLECYSTECTOMY      There were no vitals filed for this visit.  Subjective Assessment - 10/04/17 1637    Subjective  Pt states she is sore today and unsure why.  STates it is hard to get comfortable at night due to pain and discomfort.  STates MRI only shows her shoulder is really frozen but no tears or dysfunctions.    Currently in Pain?  No/denies                      [facility] Adult PT Treatment/Exercise - 10/04/17 0001      Shoulder Exercises: Supine   External Rotation  PROM    Internal Rotation  PROM    Flexion  PROM    ABduction  PROM      Shoulder Exercises: Seated   External Rotation  15 reps    Flexion  15 reps    Abduction  15 reps      Shoulder Exercises: Sidelying   ABduction  Right;10 reps      Shoulder Exercises: Standing   Other Standing Exercises  corner stretch 2X20", ER doorway  stretch 2X20"       Shoulder Exercises: Pulleys   Flexion  2 minutes    ABduction  2 minutes      Shoulder Exercises: Therapy Ball   Flexion  10 reps    ABduction  10 reps    Right/Left  10 reps      Shoulder Exercises:  ROM/Strengthening   UBE (Upper Arm Bike)  3 minutes backward L1      Manual Therapy   Manual Therapy  Passive ROM;Myofascial release    Manual therapy comments  done seperate from all other aspects of treatment     Joint Mobilization  --    Myofascial Release  to tight fascia in UE and into scapular region with active stretch    Passive ROM  all                PT Short Term Goals - 09/16/17 1619      PT SHORT TERM GOAL #1   Title  Pt Rt UE motion to improve to allow pt to easily wash and style her hair.    Time  3    Period  Weeks    Status  New    Target Date  10/07/17      PT SHORT TERM GOAL #2   Title  PT pain to be no greater than a 4/10 to allow pt to have a full night sleep     Time  3    Period  Weeks    Status  New      PT SHORT TERM GOAL #3   Title  Pt to be able to reach up to her seatbelt and buckle it without difficulty     Time  3    Period  Weeks    Status  New      PT SHORT TERM GOAL #4   Title  Pt to be completing HEP in order to increase the motion of her right arm to allow the above to occur.        PT Long Term Goals - 09/16/17 1621      PT LONG TERM GOAL #1   Title  Pt Rt shoulder  ROM to have improved to allow pt to reach into her back pocket without difficulty     Time  6    Period  Weeks    Status  New    Target Date  10/28/17      PT LONG TERM GOAL #2   Title  Pt Rt shoulder  ROM to have improved to allow pt to clasp her bra without difficulty     Time  6    Period  Weeks    Status  New      PT LONG TERM GOAL #3   Title  PT Rt shoulder ROM and strength to be increased to where pt can put her dishes up into higher cabinets without difficulty     Time  6    Period  Weeks    Status  New      PT LONG TERM GOAL #4   Title  Pt pain to be no greater than a 1/10 throughout the day 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     Time  6    Period  Weeks    Status  New            Plan - 10/04/17  1716    Clinical Impression Statement  Continued with focus on improving shoulder ROM (RT>Lt).  Manual assist to keep accessory muscles from completing motion and decreasing spasm and irritation.  Pt also c/o lumbar stress /spasm when getting towards end ROM in shoulders.  Pt is to get a soft tissue massage tomorrow for her scap and lumbar spine to help relieve this tightness.  Completed myofascial techniques during PROM to help release tightness and distract pain.  Noted improvement with added techniques with each stretch.  Pt reported overall improvment and less restriction following session.      Rehab Potential  Good    PT Frequency  2x / week    PT Duration  6 weeks    PT Treatment/Interventions  ADLs/Self Care Home Management;Cryotherapy;Ultrasound;Iontophoresis 4mg /ml Dexamethasone;Therapeutic activities;Therapeutic exercise;Patient/family education;Manual techniques;Passive range of motion    PT Next Visit Plan  Primarly shoulder mobiltiy with therex and manual PROM/joint mobs.   Educate on posture/postural strengthening.  Continue with manual techniques.     PT Home Exercise Plan  eval: wand flexion, abduction, IR/ER.  Self joint capsule stretch for posterior, inferior and anterior capsule.        Patient will benefit from skilled therapeutic intervention in order to improve the following deficits and impairments:  Decreased activity tolerance, Decreased range of motion, Decreased strength, Impaired  flexibility, Impaired UE functional use, Pain  Visit Diagnosis: Stiffness of right shoulder, not elsewhere classified  Acute pain of right shoulder     Problem List There are no active problems to display for this patient.  Diane Wolf, PTA/CLT [phone]  Diane NidaFrazier, Diane Wolf 10/04/2017, 5:24 PM   [facility] [address] , [place], [address] Phone: [phone]   Fax:  [phone]  Name: Diane Wolf MRN: [id number] Date of  Birth: 14-Jan-1966

## 2017-10-06 ENCOUNTER — Ambulatory Visit (HOSPITAL_COMMUNITY): Payer: Worker's Compensation | Admitting: Physical Therapy

## 2017-10-06 DIAGNOSIS — M25611 Stiffness of right shoulder, not elsewhere classified: Secondary | ICD-10-CM

## 2017-10-06 DIAGNOSIS — M25511 Pain in right shoulder: Secondary | ICD-10-CM

## 2017-10-06 NOTE — Therapy (Signed)
[facility] [address] [place], [place], [address] Phone: [phone]   Fax:  [phone]  Physical Therapy Treatment  Patient Details  Name: Diane Wolf MRN: [id number] Date of Birth: 1966-07-08 Referring Provider: Eugenia Mcalpine   Encounter Date: 10/06/2017  PT End of Session - 10/06/17 1747    Visit Number  6    Number of Visits  12    Date for PT Re-Evaluation  10/16/17    Authorization Type  UHC    Authorization - Visit Number  6    Authorization - Number of Visits  10    PT Start Time  1608 pt was late    PT Stop Time  1651    PT Time Calculation (min)  43 min    Activity Tolerance  Patient tolerated treatment well;No increased pain    Behavior During Therapy  [facility] for tasks assessed/performed       No past medical history on file.  Past Surgical History:  Procedure Laterality Date  . CHOLECYSTECTOMY      There were no vitals filed for this visit.  Subjective Assessment - 10/06/17 1611    Subjective  PT states she went to the massage therapist and believes it help.  She did deep tissue and cupping techniques and states she is somewhat sore today. STates she is going back every other week for 30 minute sessions concentrating on her Rt shoulder.  Currently pain in her elbow at 4/10.    Currently in Pain?  Yes    Pain Score  4     Pain Location  Elbow    Pain Orientation  Right    Pain Descriptors / Indicators  Aching;Tightness    Pain Type  Acute pain                      [facility] Adult PT Treatment/Exercise - 10/06/17 0001      Shoulder Exercises: Supine   External Rotation  PROM    Internal Rotation  PROM    Flexion  PROM    ABduction  PROM      Shoulder Exercises: Standing   Extension  Both;10 reps;Theraband    Theraband Level (Shoulder Extension)  Level 2 (Red)    Row  Both;10 reps;Theraband    Theraband Level (Shoulder Row)  Level 2 (Red)    Other Standing Exercises  corner stretch 2X20",  ER doorway  stretch 2X20"       Shoulder Exercises: Pulleys   Flexion  2 minutes    ABduction  2 minutes      Shoulder Exercises: Therapy Ball   Flexion  10 reps    ABduction  10 reps    Right/Left  10 reps      Shoulder Exercises: ROM/Strengthening   UBE (Upper Arm Bike)  3 minutes backward L1      Manual Therapy   Manual Therapy  Passive ROM;Myofascial release    Manual therapy comments  done seperate from all other aspects of treatment     Myofascial Release  to tight fascia in UE and into scapular region with active stretch    Passive ROM  all                PT Short Term Goals - 09/16/17 1619      PT SHORT TERM GOAL #1   Title  Pt Rt UE motion to improve to allow pt to easily wash and style  her hair.    Time  3    Period  Weeks    Status  New    Target Date  10/07/17      PT SHORT TERM GOAL #2   Title  PT pain to be no greater than a 4/10 to allow pt to have a full night sleep     Time  3    Period  Weeks    Status  New      PT SHORT TERM GOAL #3   Title  Pt to be able to reach up to her seatbelt and buckle it without difficulty     Time  3    Period  Weeks    Status  New      PT SHORT TERM GOAL #4   Title  Pt to be completing HEP in order to increase the motion of her right arm to allow the above to occur.        PT Long Term Goals - 09/16/17 1621      PT LONG TERM GOAL #1   Title  Pt Rt shoulder  ROM to have improved to allow pt to reach into her back pocket without difficulty     Time  6    Period  Weeks    Status  New    Target Date  10/28/17      PT LONG TERM GOAL #2   Title  Pt Rt shoulder  ROM to have improved to allow pt to clasp her bra without difficulty     Time  6    Period  Weeks    Status  New      PT LONG TERM GOAL #3   Title  PT Rt shoulder ROM and strength to be increased to where pt can put her dishes up into higher cabinets without difficulty     Time  6    Period  Weeks    Status  New      PT LONG TERM GOAL #4    Title  Pt pain to be no greater than a 1/10 throughout the day 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     Time  6    Period  Weeks    Status  New            Plan - 10/06/17 1748    Clinical Impression Statement  Focus on shoulder ROM, specifically Rt UE.  Less tightness palpated in scapular region, however still with large spasm in deltoid region.  PT also with some nerve irritation into her elbow while completing PROM.  All symptoms decreased with manual, however did not resolve.  PT also did not have any lumbar stress or spasm during session today.      Rehab Potential  Good    PT Frequency  2x / week    PT Duration  6 weeks    PT Treatment/Interventions  ADLs/Self Care Home Management;Cryotherapy;Ultrasound;Iontophoresis 4mg /ml Dexamethasone;Therapeutic activities;Therapeutic exercise;Patient/family education;Manual techniques;Passive range of motion    PT Next Visit Plan  Primarly shoulder mobiltiy with therex and manual PROM/joint mobs.   Educate on posture/postural strengthening.  Continue with manual techniques.     PT Home Exercise Plan  eval: wand flexion, abduction, IR/ER.  Self joint capsule stretch for posterior, inferior and anterior capsule.        Patient will benefit from skilled therapeutic intervention in order to improve the following deficits and impairments:  Decreased activity tolerance, Decreased range of motion, Decreased strength, Impaired flexibility, Impaired UE functional use, Pain  Visit Diagnosis: Stiffness of right shoulder, not elsewhere classified  Acute pain of right shoulder     Problem List There are no active problems to display for this patient.  Diane Wolf, PTA/CLT [phone]  Diane Wolf, Diane Wolf 10/06/2017, 5:50 PM  [facility] [address] [place], [place], [address] Phone: [phone]   Fax:  [phone]  Name: Diane Wolf MRN: [id number] Date of Birth:  1966/01/18

## 2017-10-11 ENCOUNTER — Encounter (HOSPITAL_COMMUNITY): Payer: 59 | Admitting: Physical Therapy

## 2017-10-11 ENCOUNTER — Telehealth (HOSPITAL_COMMUNITY): Payer: Self-pay | Admitting: Family Medicine

## 2017-10-11 NOTE — Telephone Encounter (Signed)
10/11/17  pt cx said her husband tried to get out of the driveway and was no successful... just add to the end of her schedule she says

## 2017-10-13 ENCOUNTER — Ambulatory Visit (HOSPITAL_COMMUNITY): Payer: Worker's Compensation | Admitting: Physical Therapy

## 2017-10-13 ENCOUNTER — Encounter (HOSPITAL_COMMUNITY): Payer: Self-pay | Admitting: Physical Therapy

## 2017-10-13 DIAGNOSIS — M25511 Pain in right shoulder: Secondary | ICD-10-CM

## 2017-10-13 DIAGNOSIS — M25611 Stiffness of right shoulder, not elsewhere classified: Secondary | ICD-10-CM

## 2017-10-13 NOTE — Therapy (Addendum)
[facility] [address] [place], [place], [address] Phone: [phone]   Fax:  [phone]  Physical Therapy Treatment  Patient Details  Name: Diane Wolf MRN: [id number] Date of Birth: 1966/03/26 Referring Provider: Eugenia Mcalpine    Encounter Date: 10/13/2017  PT End of Session - 10/13/17 1840    Visit Number  7    Number of Visits  12    Date for PT Re-Evaluation  10/16/17    Authorization Type  UHC    Authorization - Visit Number  7    Authorization - Number of Visits  10    PT Start Time  1610 Pt late for appointment    PT Stop Time  1650    PT Time Calculation (min)  40 min    Activity Tolerance  Patient tolerated treatment well;No increased pain    Behavior During Therapy  [facility] for tasks assessed/performed       History reviewed. No pertinent past medical history.  Past Surgical History:  Procedure Laterality Date  . CHOLECYSTECTOMY      There were no vitals filed for this visit.  Subjective Assessment - 10/13/17 1615    Subjective  Pt states that she is starting to have pain again.  Pt points deltoid tuberosity and posterior aspect ot her shoulder.  Unsure why the pain started again.   Pt is noticing that she is able to move her shoulder more and more.     Currently in Pain?  Yes    Pain Score  4     Pain Location  Shoulder    Pain Orientation  Right    Pain Descriptors / Indicators  Aching    Pain Radiating Towards  to the elbow    Pain Onset  1 to 4 weeks ago    Pain Frequency  Intermittent    Aggravating Factors   unsure    Pain Relieving Factors  unsure          [facility] PT Assessment - 10/13/17 0001      Assessment   Medical Diagnosis  Rt shoulder impingement    Referring Provider  Eugenia Mcalpine     Onset Date/Surgical Date  01/13/17    Next MD Visit  10/13/2017    Prior Therapy  none      Precautions   Precautions  None      Restrictions   Weight Bearing Restrictions  No      Balance Screen    Has the patient fallen in the past 6 months  No    Has the patient had a decrease in activity level because of a fear of falling?   No    Is the patient reluctant to leave their home because of a fear of falling?   No      Prior Function   Level of Independence  Independent      Cognition   Overall Cognitive Status  Within Functional Limits for tasks assessed      Observation/Other Assessments   Focus on Therapeutic Outcomes (FOTO)   60      AROM   Overall AROM Comments  supine     Right Shoulder Flexion  133 Degrees was 133 on 11/29    Right Shoulder ABduction  90 Degrees was 90 on 11/29    Right Shoulder Internal Rotation  38 Degrees was 40    Right Shoulder External Rotation  40 Degrees was 30  on 11/29  Strength   Right Shoulder Flexion  4/5    Right Shoulder Extension  4-/5    Right Shoulder ABduction  4/5    Right Shoulder Internal Rotation  4+/5    Right Shoulder External Rotation  3+/5    Right Shoulder Horizontal ABduction  5/5    Right Shoulder Horizontal ADduction  5/5                  [facility] Adult PT Treatment/Exercise - 10/13/17 0001      Shoulder Exercises: Supine   External Rotation  PROM    Internal Rotation  PROM    Flexion  PROM    ABduction  PROM      Shoulder Exercises: Standing   Other Standing Exercises  wall arch x 5      Modalities   Modalities  Ultrasound Laser:  Done on chronic bone and joint pain 5 times along jt      Ultrasound   Ultrasound Location  RT shoulder    Ultrasound Parameters  75J/cm2 x 5 reps chronic bone/jt pain    Ultrasound Goals  Pain      Manual Therapy   Manual Therapy  Joint mobilization;Passive ROM               PT Short Term Goals - 10/13/17 1842      PT SHORT TERM GOAL #1   Title  Pt Rt UE motion to improve to allow pt to easily wash and style her hair.    Time  3    Period  Weeks    Status  On-going      PT SHORT TERM GOAL #2   Title  PT pain to be no greater than a 4/10 to  allow pt to have a full night sleep     Time  3    Period  Weeks    Status  On-going      PT SHORT TERM GOAL #3   Title  Pt to be able to reach up to her seatbelt and buckle it without difficulty     Time  3    Period  Weeks    Status  On-going      PT SHORT TERM GOAL #4   Title  Pt to be completing HEP in order to increase the motion of her right arm to allow the above to occur.    Status  On-going        PT Long Term Goals - 10/13/17 1843      PT LONG TERM GOAL #1   Title  Pt Rt shoulder  ROM to have improved to allow pt to reach into her back pocket without difficulty     Time  6    Period  Weeks    Status  On-going      PT LONG TERM GOAL #2   Title  Pt Rt shoulder  ROM to have improved to allow pt to clasp her bra without difficulty     Time  6    Period  Weeks    Status  On-going      PT LONG TERM GOAL #3   Title  PT Rt shoulder ROM and strength to be increased to where pt can put her dishes up into higher cabinets without difficulty     Time  6    Period  Weeks    Status  On-going      PT LONG TERM GOAL #4  Title  Pt pain to be no greater than a 1/10 throughout the day 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     Time  6    Period  Weeks    Status  On-going            Plan - 10/13/17 1841    Clinical Impression Statement  Pt reassessed with minimal change from 11/27 ROM reading.  Focused on Joint mobilization not PROM and began Laser treatments for pain.     Rehab Potential  Good    PT Frequency  2x / week    PT Duration  6 weeks    PT Treatment/Interventions  ADLs/Self Care Home Management;Cryotherapy;Ultrasound;Iontophoresis 4mg /ml Dexamethasone;Therapeutic activities;Therapeutic exercise;Patient/family education;Manual techniques;Passive range of motion ; trial of laser    PT Next Visit Plan  focus on jt mobs not PROM as pt can stretch at home.  Begin scapular mobs and continue with laser.     PT Home Exercise Plan  eval:  wand flexion, abduction, IR/ER.  Self joint capsule stretch for posterior, inferior and anterior capsule.        Patient will benefit from skilled therapeutic intervention in order to improve the following deficits and impairments:  Decreased activity tolerance, Decreased range of motion, Decreased strength, Impaired flexibility, Impaired UE functional use, Pain  Visit Diagnosis: Stiffness of right shoulder, not elsewhere classified  Acute pain of right shoulder     Problem List There are no active problems to display for this patient.   Virgina OrganCynthia Russell, PT CLT [phone] 10/13/2017, 6:44 PM  [place] [facility] [address] [place], [place], [address] Phone: [phone]   Fax:  [phone]  Name: Diane Wolf MRN: [id number] Date of Birth: [birth year]

## 2017-10-18 ENCOUNTER — Encounter (HOSPITAL_COMMUNITY): Payer: Self-pay

## 2017-10-18 ENCOUNTER — Ambulatory Visit (HOSPITAL_COMMUNITY): Payer: Worker's Compensation

## 2017-10-18 DIAGNOSIS — M25511 Pain in right shoulder: Secondary | ICD-10-CM

## 2017-10-18 DIAGNOSIS — M25611 Stiffness of right shoulder, not elsewhere classified: Secondary | ICD-10-CM

## 2017-10-18 NOTE — Therapy (Signed)
[facility] [facility] [address] [place], [place], [address] Phone: [phone]   Fax:  [phone]  Physical Therapy Treatment  Patient Details  Name: Diane Sniffmy S Mickler MRN: [id number] Date of Birth: [birth year] Referring Provider: Eugenia Mcalpineobert Collins    Encounter Date: 10/18/2017  PT End of Session - 10/18/17 1612    Visit Number  8    Number of Visits  12    Date for PT Re-Evaluation  10/16/17    Authorization Type  UHC    Authorization - Visit Number  8    Authorization - Number of Visits  10    PT Start Time  1608 Pt late for apt    PT Stop Time  1652 6 min laser- not included with billing    PT Time Calculation (min)  44 min    Activity Tolerance  Patient tolerated treatment well;No increased pain    Behavior During Therapy  [facility]       History reviewed. No pertinent past medical history.  Past Surgical History:  Procedure Laterality Date  . CHOLECYSTECTOMY      There were no vitals filed for this visit.  Subjective Assessment - 10/18/17 1611    Subjective  Pt reports positive results following the laser last week.  Continues to be compliant with HEP.  Minimal pain today.      Patient Stated Goals  no pain with activity     Currently in Pain?  No/denies                      [facility]18[facility]17 0001      Shoulder Exercises: Prone   Extension  10 reps    External Rotation  10 reps;AAROM    Internal Rotation  10 reps;AAROM    Other Prone Exercises  rows 10x      Ultrasound   Ultrasound Location  Rt Shoulder     Ultrasound Parameters  Laser 75J/cm2 x 5 reps bone/jt pain    Ultrasound Goals  Pain      Manual Therapy   Manual Therapy  Joint mobilization;Passive ROM    Manual therapy comments  done seperate from all other aspects of treatment     Joint Mobilization  ant, posterior and inferior glides; scapular mobs in prone    Passive ROM  all                 PT Short Term Goals - 10/13/17 1842      PT SHORT TERM GOAL #1   Title  Pt Rt UE motion to improve to allow pt to easily wash and style her hair.    Time  3    Period  Weeks    Status  On-going      PT SHORT TERM GOAL #2   Title  PT pain to be no greater than a 4/10 to allow pt to have a full night sleep     Time  3    Period  Weeks    Status  On-going      PT SHORT TERM GOAL #3   Title  Pt to be able to reach up to her seatbelt and buckle it without difficulty     Time  3    Period  Weeks    Status  On-going      PT SHORT TERM GOAL #4   Title  Pt to be completing HEP  in order to increase the motion of her right arm to allow the above to occur.    Status  On-going        PT Long Term Goals - 10/13/17 1843      PT LONG TERM GOAL #1   Title  Pt Rt shoulder  ROM to have improved to allow pt to reach into her back pocket without difficulty     Time  6    Period  Weeks    Status  On-going      PT LONG TERM GOAL #2   Title  Pt Rt shoulder  ROM to have improved to allow pt to clasp her bra without difficulty     Time  6    Period  Weeks    Status  On-going      PT LONG TERM GOAL #3   Title  PT Rt shoulder ROM and strength to be increased to where pt can put her dishes up into higher cabinets without difficulty     Time  6    Period  Weeks    Status  On-going      PT LONG TERM GOAL #4   Title  Pt pain to be no greater than a 1/10 throughout the day 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     Time  6    Period  Weeks    Status  On-going            Plan - 10/18/17 1708    Clinical Impression Statement  Session focus on shoulder mobility with manual joint and scapular mobs with passive movements, reports of ease with end range.  EOS with laser for pain control.      Rehab Potential  Good    PT Frequency  2x / week    PT Duration  6 weeks    PT Treatment/Interventions  ADLs/Self Care Home  Management;Cryotherapy;Ultrasound;Iontophoresis 4mg /ml Dexamethasone;Therapeutic activities;Therapeutic exercise;Patient/family education;Manual techniques;Passive range of motion    PT Next Visit Plan  Reassess next session prior MD apt.  Continue joint and scapular mobs and laser.    PT Home Exercise Plan  eval: wand flexion, abduction, IR/ER.  Self joint capsule stretch for posterior, inferior and anterior capsule.        Patient will benefit from skilled therapeutic intervention in order to improve the following deficits and impairments:  Decreased activity tolerance, Decreased range of motion, Decreased strength, Impaired flexibility, Impaired UE functional use, Pain  Visit Diagnosis: Stiffness of right shoulder, not elsewhere classified  Acute pain of right shoulder     Problem List There are no active problems to display for this patient.  7842 Creek DriveCasey Brooks Stotz, LPTA; CBIS [phone]  Juel BurrowCockerham, Ryne Mctigue Jo 10/18/2017, 5:22 PM  [facility] [address] [place], [place], [address] Phone: [phone]   Fax:  [phone]  Name: Diane Wolf MRN: [id number] Date of Birth: Mar 04, 1966

## 2017-10-20 ENCOUNTER — Ambulatory Visit (HOSPITAL_COMMUNITY): Payer: Worker's Compensation | Admitting: Physical Therapy

## 2017-10-20 DIAGNOSIS — M25611 Stiffness of right shoulder, not elsewhere classified: Secondary | ICD-10-CM

## 2017-10-20 DIAGNOSIS — M25511 Pain in right shoulder: Secondary | ICD-10-CM

## 2017-10-20 NOTE — Addendum Note (Signed)
Addended by: Bella KennedyUSSELL, CYNTHIA J on: 10/20/2017 11:55 AM   Modules accepted: Orders

## 2017-10-20 NOTE — Patient Instructions (Addendum)
ROM: Inferior Capsule Stretch    Gently pull on right  raised elbow with other hand until stretch is felt in shoulder. Hold _20-30___ seconds. Repeat _3___ times per set. Do ____ sets per session. Do ___2_ sessions per day. 1 http://orth.exer.us/884   Copyright  VHI. All rights reserved.  ROM: Anterior Glide - Extension    With right arm resting comfortably on table behind, apply gentle force down and slightly forward through shoulder. Hold __20-30__ seconds. Relax. Repeat ___3_ times per set. Do __1__ sets per session. Do __2__ sessions per day.  http://orth.exer.us/784   Copyright  VHI. All rights reserved.  ROM: External Rotation    Keeping right forearm palm down on table, bend forward at waist until stretch is felt. Hold _30___ seconds. Repeat __3__ times per set. Do __1__ sets per session. Do __2__ sessions per day.  http://orth.exer.us/762   Copyright  VHI. All rights reserved.  ROM: Inferior Glide - Abduction Below 90    With right arm resting comfortably on table, apply gentle force down through shoulder. Hold __30__ seconds. Relax. Repeat __3__ times per set. Do _1___ sets per session. Do _3___ sessions per day.  http://orth.exer.us/782   Copyright  VHI. All rights reserved.  ROM: Posterior / Inferior Glide - Flexion Above 90    With right forearm resting comfortably on wall in front, relax shoulder and apply gentle force down through shoulder. Hold _30___ seconds. Relax. Repeat __3__ times per set. Do __1__ sets per session. Do __2__ sessions per day.  http://orth.exer.us/788   Copyright  VHI. All rights reserved.  ROM: Posterior Glide    Shift body weight down between arms until stretch is felt. Hold __30__ seconds. Repeat __3__ times per set. Do __1_ sets per session. Do _2___ sessions per day.  http://orth.exer.us/772   Copyright  VHI. All rights reserved.

## 2017-10-20 NOTE — Therapy (Addendum)
[place], [place], [address] Phone: [phone]   Fax:  [phone]  Physical Therapy Treatment  Patient Details  Name: Diane Wolf MRN: [id number] Date of Birth: July 20, 1966 Referring Provider: Sydnee Cabal    Encounter Date: 10/20/2017  PT End of Session - 10/20/17 1618    Visit Number  9    Number of Visits  16    Date for PT Re-Evaluation  12/16/17    Authorization Type  UHC    Authorization - Visit Number  9    Authorization - Number of Visits  16    PT Start Time  1522    PT Stop Time  1610    PT Time Calculation (min)  48 min    Activity Tolerance  Patient tolerated treatment well;No increased pain    Behavior During Therapy  [facility] for tasks assessed/performed       No past medical history on file.  Past Surgical History:  Procedure Laterality Date  . CHOLECYSTECTOMY      There were no vitals filed for this visit.  Subjective Assessment - 10/20/17 1534    Subjective  PT states that she had a massage on her shoulder earlier today and is feeling better.     Patient Stated Goals  no pain with activity     Currently in Pain?  Yes    Pain Score  1     Pain Location  Shoulder    Pain Orientation  Right    Pain Descriptors / Indicators  Aching;Dull    Pain Type  Chronic pain    Pain Onset  More than a month ago    Pain Frequency  Intermittent         [facility] PT Assessment - 10/20/17 0001      AROM   Right Shoulder Flexion  145 Degrees was 133 on 12/13     Right Shoulder ABduction  115 Degrees was 90     Right Shoulder Internal Rotation  48 Degrees was 38 on 12/13     Right Shoulder External Rotation  50 Degrees was 40                   [facility] Adult PT Treatment/Exercise - 10/20/17 0001      Exercises   Exercises  Shoulder      Shoulder Exercises: Supine   External Rotation  PROM    Internal Rotation  PROM    Flexion  PROM    ABduction  PROM      Shoulder Exercises:  Seated   Other Seated Exercises  Instructed pt in inferior/ posterior jt self mobs.  Given instuctions for HEP       Shoulder Exercises: Prone   Extension  10 reps    Horizontal ABduction 1  Strengthening;Right;10 reps      Shoulder Exercises: Standing   Flexion  Strengthening;Both;5 reps    ABduction  Strengthening;Both;5 reps    Other Standing Exercises  wall arch x 5      Ultrasound   Laser  Rt Shoulder jt line post, superior and anterior       Laser 75J/cm2 x 5 reps       Pain      Manual Therapy   Manual Therapy  Joint mobilization;Passive ROM    Manual therapy comments  done seperate from all other aspects of treatment     Joint Mobilization  scapular  PT Education - 10/20/17 1615    Education provided  Yes    Education Details  How to perform self mobilization     Person(s) Educated  Patient    Methods  Demonstration;Handout;Verbal cues    Comprehension  Returned demonstration       PT Short Term Goals - 10/20/17 1622      PT SHORT TERM GOAL #1   Title  Pt Rt UE motion to improve to allow pt to easily wash and style her hair.    Time  3    Period  Weeks    Status  Achieved      PT SHORT TERM GOAL #2   Title  PT pain to be no greater than a 4/10 to allow pt to have a full night sleep     Time  3    Period  Weeks    Status  Partially Met      PT SHORT TERM GOAL #3   Title  Pt to be able to reach up to her seatbelt and buckle it without difficulty     Time  3    Period  Weeks    Status  Achieved      PT SHORT TERM GOAL #4   Title  Pt to be completing HEP in order to increase the motion of her right arm to allow the above to occur.    Status  Achieved        PT Long Term Goals - 10/20/17 1622      PT LONG TERM GOAL #1   Title  Pt Rt shoulder  ROM to have improved to allow pt to reach into her back pocket without difficulty     Time  6    Period  Weeks    Status  Partially Met      PT LONG TERM GOAL #2   Title  Pt Rt shoulder   ROM to have improved to allow pt to clasp her bra without difficulty     Time  6    Period  Weeks    Status  On-going      PT LONG TERM GOAL #3   Title  PT Rt shoulder ROM and strength to be increased to where pt can put her dishes up into higher cabinets without difficulty     Time  6    Period  Weeks    Status  Partially Met      PT LONG TERM GOAL #4   Title  Pt pain to be no greater than a 1/10 throughout the day 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     Time  6    Period  Weeks    Status  On-going            Plan - 10/20/17 1620    Clinical Impression Statement  Pt remeasured with significant improvement since Laser treatment and focus on joint mobilizations.  Pt instructed in self joint mobilizations with understanding.  PT will continue to benefit from skilled physical therapy to achieve maximal ROM for maximal functioal abilltiy.     Rehab Potential  Good    PT Frequency  2x / week    PT Duration  6 weeks    PT Treatment/Interventions  ADLs/Self Care Home Management;Cryotherapy;Ultrasound;Iontophoresis 64m/ml Dexamethasone;Therapeutic activities;Therapeutic exercise;Patient/family education;Manual techniques;Passive range of motion    PT Next Visit Plan    Continue joint and scapular mobs and  laser.    PT Home Exercise Plan  eval: wand flexion, abduction, IR/ER.  Self joint capsule stretch for posterior, inferior and anterior capsule.        Patient will benefit from skilled therapeutic intervention in order to improve the following deficits and impairments:  Decreased activity tolerance, Decreased range of motion, Decreased strength, Impaired flexibility, Impaired UE functional use, Pain  Visit Diagnosis: Stiffness of right shoulder, not elsewhere classified  Acute pain of right shoulder     Problem List There are no active problems to display for this patient.   Rayetta Humphrey, PT CLT [phone] 10/20/2017, 4:24 PM  [place] [address] [place], [place], [address] Phone: [phone]   Fax:  [phone]  Name: Diane Wolf MRN: [id number] Date of Birth: 1966-08-01

## 2017-10-27 ENCOUNTER — Ambulatory Visit (HOSPITAL_COMMUNITY): Payer: Worker's Compensation | Admitting: Physical Therapy

## 2017-10-27 ENCOUNTER — Encounter (HOSPITAL_COMMUNITY): Payer: Self-pay | Admitting: Physical Therapy

## 2017-10-27 DIAGNOSIS — M25611 Stiffness of right shoulder, not elsewhere classified: Secondary | ICD-10-CM

## 2017-10-27 NOTE — Therapy (Signed)
[place], [place], [address] Phone: [phone]   Fax:  [phone]  Physical Therapy Treatment  Patient Details  Name: Diane Wolf MRN: [id number] Date of Birth: 1966/07/12 Referring Provider: Sydnee Cabal    Encounter Date: 10/27/2017  PT End of Session - 10/27/17 1349    Visit Number  10    Number of Visits  16    Date for PT Re-Evaluation  12/16/17    Authorization Type  UHC    Authorization - Visit Number  10    Authorization - Number of Visits  16    PT Start Time  5176    PT Stop Time  1607    PT Time Calculation (min)  44 min    Activity Tolerance  Patient tolerated treatment well;No increased pain    Behavior During Therapy  [facility] for tasks assessed/performed       History reviewed. No pertinent past medical history.  Past Surgical History:  Procedure Laterality Date  . CHOLECYSTECTOMY      There were no vitals filed for this visit.  Subjective Assessment - 10/27/17 1308    Subjective  PT states that she can tell when she does not do her exercises     Patient Stated Goals  no pain with activity     Pain Score  3     Pain Location  Shoulder    Pain Orientation  Right    Pain Descriptors / Indicators  Aching    Pain Onset  More than a month ago         [facility] PT Assessment - 10/27/17 0001      AROM   Right Shoulder Flexion  145 Degrees    Right Shoulder ABduction  120 Degrees    Right Shoulder Internal Rotation  55 Degrees    Right Shoulder External Rotation  45 Degrees                  [facility] Adult PT Treatment/Exercise - 10/27/17 0001      Exercises   Exercises  Shoulder      Shoulder Exercises: Supine   External Rotation  PROM;AROM;5 reps PROM holding x 10 seconds     Internal Rotation  PROM;AROM;5 reps PROM holding x 10 seconds     Flexion  PROM;AROM;5 reps PROM holding x 10 seconds     ABduction  PROM;AROM PROM holding x 10 seconds       Shoulder Exercises:  Seated   Other Seated Exercises  Instructed pt in inferior/ posterior jt self mobs.  Given instuctions for HEP       Shoulder Exercises: Prone   Extension  --    Horizontal ABduction 1  Strengthening;Right;10 reps      Shoulder Exercises: Standing   Flexion  Strengthening;Both;5 reps    ABduction  Strengthening;Both;5 reps    Other Standing Exercises  --      Ultrasound   Ultrasound Location  Rt shoulder     Ultrasound Parameters  Laser 75 J/cm2 5 treatments.     Ultrasound Goals  Pain      Manual Therapy   Manual Therapy  Joint mobilization;Passive ROM    Manual therapy comments  done seperate from all other aspects of treatment     Joint Mobilization  scapular and shoulder                PT Short Term Goals - 10/20/17 1622  PT SHORT TERM GOAL #1   Title  Pt Rt UE motion to improve to allow pt to easily wash and style her hair.    Time  3    Period  Weeks    Status  Achieved      PT SHORT TERM GOAL #2   Title  PT pain to be no greater than a 4/10 to allow pt to have a full night sleep     Time  3    Period  Weeks    Status  Partially Met      PT SHORT TERM GOAL #3   Title  Pt to be able to reach up to her seatbelt and buckle it without difficulty     Time  3    Period  Weeks    Status  Achieved      PT SHORT TERM GOAL #4   Title  Pt to be completing HEP in order to increase the motion of her right arm to allow the above to occur.    Status  Achieved        PT Long Term Goals - 10/20/17 1622      PT LONG TERM GOAL #1   Title  Pt Rt shoulder  ROM to have improved to allow pt to reach into her back pocket without difficulty     Time  6    Period  Weeks    Status  Partially Met      PT LONG TERM GOAL #2   Title  Pt Rt shoulder  ROM to have improved to allow pt to clasp her bra without difficulty     Time  6    Period  Weeks    Status  On-going      PT LONG TERM GOAL #3   Title  PT Rt shoulder ROM and strength to be increased to where pt can  put her dishes up into higher cabinets without difficulty     Time  6    Period  Weeks    Status  Partially Met      PT LONG TERM GOAL #4   Title  Pt pain to be no greater than a 1/10 throughout the day 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     Time  6    Period  Weeks    Status  On-going            Plan - 10/27/17 1349    Clinical Impression Statement  Pt has been busy with Christmas and has not been stretching as much as normal. Minimal improvement but no significant decrease except in ER.  Pt returned to MD who wants one more month of treatment.     Rehab Potential  Good    PT Frequency  2x / week    PT Duration  6 weeks    PT Treatment/Interventions  ADLs/Self Care Home Management;Cryotherapy;Ultrasound;Iontophoresis 36m/ml Dexamethasone;Therapeutic activities;Therapeutic exercise;Patient/family education;Manual techniques;Passive range of motion    PT Next Visit Plan    Continue joint and scapular mobs and laser.    PT Home Exercise Plan  eval: wand flexion, abduction, IR/ER.  Self joint capsule stretch for posterior, inferior and anterior capsule.        Patient will benefit from skilled therapeutic intervention in order to improve the following deficits and impairments:  Decreased activity tolerance, Decreased range of motion, Decreased strength, Impaired flexibility, Impaired UE functional use, Pain  Visit  Diagnosis: Stiffness of right shoulder, not elsewhere classified     Problem List There are no active problems to display for this patient.  Diane Wolf, PT CLT [phone] 10/27/2017, 1:51 PM  [facility] [address] [place], [place], [address] Phone: [phone]   Fax:  [phone]  Name: Diane Wolf MRN: [id number] Date of Birth: Jun 23, 1966

## 2017-10-28 ENCOUNTER — Encounter (HOSPITAL_COMMUNITY): Payer: Self-pay | Admitting: Physical Therapy

## 2017-10-28 ENCOUNTER — Ambulatory Visit (HOSPITAL_COMMUNITY): Payer: Worker's Compensation | Admitting: Physical Therapy

## 2017-10-28 DIAGNOSIS — M25611 Stiffness of right shoulder, not elsewhere classified: Secondary | ICD-10-CM

## 2017-10-28 DIAGNOSIS — M25511 Pain in right shoulder: Secondary | ICD-10-CM

## 2017-10-28 NOTE — Therapy (Signed)
[place], [place], [address] Phone: [phone]   Fax:  [phone]  Physical Therapy Treatment  Patient Details  Name: Diane Wolf MRN: [id number] Date of Birth: 02-15-1966 Referring Provider: Sydnee Cabal    Encounter Date: 10/28/2017  PT End of Session - 10/28/17 1213    Visit Number  11    Number of Visits  16    Date for PT Re-Evaluation  12/16/17    Authorization Type  UHC    Authorization - Visit Number  11    Authorization - Number of Visits  16    PT Start Time  1120    PT Stop Time  1208    PT Time Calculation (min)  48 min    Activity Tolerance  Patient tolerated treatment well;No increased pain    Behavior During Therapy  [facility] for tasks assessed/performed       History reviewed. No pertinent past medical history.  Past Surgical History:  Procedure Laterality Date  . CHOLECYSTECTOMY      There were no vitals filed for this visit.  Subjective Assessment - 10/28/17 1122    Subjective  PT is not too sore today actually feeling better.     Patient Stated Goals  no pain with activity     Currently in Pain?  Yes    Pain Score  1     Pain Location  Shoulder    Pain Orientation  Right    Pain Descriptors / Indicators  Aching    Pain Onset  More than a month ago                      [facility] Adult PT Treatment/Exercise - 10/28/17 0001      Exercises   Exercises  Shoulder      Shoulder Exercises: Supine   External Rotation  PROM;AROM;5 reps PROM holding x 10 seconds     Internal Rotation  PROM;AROM;5 reps PROM holding x 10 seconds     Flexion  PROM;AROM;5 reps PROM holding x 10 seconds     ABduction  PROM;AROM PROM holding x 10 seconds       Shoulder Exercises: Seated   Other Seated Exercises  Instructed pt in inferior/ posterior jt self mobs.  Given instuctions for HEP       Shoulder Exercises: Prone   Retraction  Right;10 reps    Retraction Weight (lbs)  2     Extension  10 reps;Weights    Extension Weight (lbs)  2    Horizontal ABduction 1  Strengthening;Right;10 reps      Shoulder Exercises: Standing   External Rotation  AAROM;Right;5 reps;Limitations    Flexion  Strengthening;Both;5 reps    ABduction  Strengthening;Both;5 reps      Laser    Location  Rt shoulder      Parameters  Laser 75 J/cm2 x 5 application      Goals  Pain      Manual Therapy   Manual Therapy  Joint mobilization;Passive ROM    Manual therapy comments  done seperate from all other aspects of treatment     Joint Mobilization  scapular and shoulder                PT Short Term Goals - 10/20/17 1622      PT SHORT TERM GOAL #1   Title  Pt Rt UE motion to improve to allow pt to easily  wash and style her hair.    Time  3    Period  Weeks    Status  Achieved      PT SHORT TERM GOAL #2   Title  PT pain to be no greater than a 4/10 to allow pt to have a full night sleep     Time  3    Period  Weeks    Status  Partially Met      PT SHORT TERM GOAL #3   Title  Pt to be able to reach up to her seatbelt and buckle it without difficulty     Time  3    Period  Weeks    Status  Achieved      PT SHORT TERM GOAL #4   Title  Pt to be completing HEP in order to increase the motion of her right arm to allow the above to occur.    Status  Achieved        PT Long Term Goals - 10/20/17 1622      PT LONG TERM GOAL #1   Title  Pt Rt shoulder  ROM to have improved to allow pt to reach into her back pocket without difficulty     Time  6    Period  Weeks    Status  Partially Met      PT LONG TERM GOAL #2   Title  Pt Rt shoulder  ROM to have improved to allow pt to clasp her bra without difficulty     Time  6    Period  Weeks    Status  On-going      PT LONG TERM GOAL #3   Title  PT Rt shoulder ROM and strength to be increased to where pt can put her dishes up into higher cabinets without difficulty     Time  6    Period  Weeks    Status  Partially Met       PT LONG TERM GOAL #4   Title  Pt pain to be no greater than a 1/10 throughout the day 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     Time  6    Period  Weeks    Status  On-going            Plan - 10/28/17 1213    Clinical Impression Statement  Pt states jt mobs made her shoulder feel better.  Reviewed doorjam ER stretch and IR/ER in neutral as pt had stopped doing these exercises.  Stressed the importance of gaining ROM in ER to pt.  Pt needed minimal  assistance with horizontal abduction.      Rehab Potential  Good    PT Frequency  2x / week    PT Duration  6 weeks    PT Treatment/Interventions  ADLs/Self Care Home Management;Cryotherapy;Ultrasound;Iontophoresis 39m/ml Dexamethasone;Therapeutic activities;Therapeutic exercise;Patient/family education;Manual techniques;Passive range of motion    PT Next Visit Plan    Continue joint and scapular mobs, PROM and laser.  See how active sitting ROM appears next visit.     PT Home Exercise Plan  eval: wand flexion, abduction, IR/ER.  Self joint capsule stretch for posterior, inferior and anterior capsule.        Patient will benefit from skilled therapeutic intervention in order to improve the following deficits and impairments:  Decreased activity tolerance, Decreased range of motion, Decreased strength, Impaired flexibility, Impaired UE functional use, Pain  Visit Diagnosis: Stiffness of right shoulder, not elsewhere classified  Acute pain of right shoulder     Problem List There are no active problems to display for this patient.   Rayetta Humphrey, PT CLT [phone] 10/28/2017, 12:16 PM  [facility] [address] [place], [place], [address] Phone: [phone]   Fax:  [phone]  Name: Diane Wolf MRN: [id number] Date of Birth: 12/27/1965

## 2017-11-02 ENCOUNTER — Ambulatory Visit (HOSPITAL_COMMUNITY): Payer: Worker's Compensation | Attending: Specialist | Admitting: Physical Therapy

## 2017-11-02 ENCOUNTER — Encounter (HOSPITAL_COMMUNITY): Payer: Self-pay | Admitting: Physical Therapy

## 2017-11-02 DIAGNOSIS — M25511 Pain in right shoulder: Secondary | ICD-10-CM | POA: Insufficient documentation

## 2017-11-02 DIAGNOSIS — M25611 Stiffness of right shoulder, not elsewhere classified: Secondary | ICD-10-CM | POA: Insufficient documentation

## 2017-11-03 NOTE — Therapy (Signed)
[place], [place], [address] Phone: [phone]   Fax:  [phone]  Physical Therapy Treatment  Patient Details  Name: Diane Wolf MRN: [id number] Date of Birth: September 15, 1966 Referring Provider: Sydnee Cabal    Encounter Date: 11/02/2017  PT End of Session - 11/02/17 1334    Visit Number  12    Number of Visits  16    Date for PT Re-Evaluation  12/16/17    Authorization Type  UHC    Authorization - Visit Number  12    Authorization - Number of Visits  16    PT Start Time  1130    PT Stop Time  1215    PT Time Calculation (min)  45 min    Activity Tolerance  Patient tolerated treatment well;No increased pain    Behavior During Therapy  [facility] for tasks assessed/performed       History reviewed. No pertinent past medical history.  Past Surgical History:  Procedure Laterality Date  . CHOLECYSTECTOMY      There were no vitals filed for this visit.  Subjective Assessment - 11/03/17 1332    Subjective  PT states that she is not having any pain just stiffness in her shoulder     Patient Stated Goals  no pain with activity     Currently in Pain?  Yes    Pain Score  2     Pain Location  Arm    Pain Orientation  Right    Pain Descriptors / Indicators  Aching    Pain Onset  More than a month ago         [facility] PT Assessment - 11/02/17 0001      AROM   Right Shoulder Flexion  145 Degrees    Right Shoulder ABduction  -- Not pure     Right Shoulder Internal Rotation  55 Degrees    Right Shoulder External Rotation  45 Degrees                  [facility] Adult PT Treatment/Exercise - 11/02/17 0001      Exercises   Exercises  Shoulder      Shoulder Exercises: Supine   External Rotation  PROM;AROM;5 reps PROM holding x 10 seconds     Internal Rotation  PROM;AROM;5 reps PROM holding x 10 seconds     Flexion  PROM;AROM;5 reps PROM holding x 10 seconds     ABduction  PROM;AROM PROM holding x 10  seconds       Shoulder Exercises: Seated   Flexion  Both;5 reps    Abduction  Both;5 reps      Shoulder Exercises: Standing   External Rotation  AAROM;Right;5 reps;Limitations    Flexion  Strengthening;Both;5 reps    ABduction  Strengthening;Both;5 reps    Other Standing Exercises  wall arch x 10    Other Standing Exercises  Abduction x 10       Ultrasound   Ultrasound Location  Rt shoulder    Ultrasound Parameters  Laser 75 J/cm x 5 application    Ultrasound Goals  Pain      Manual Therapy   Manual Therapy  Joint mobilization;Passive ROM    Manual therapy comments  done seperate from all other aspects of treatment     Joint Mobilization  scapular and shoulder                PT Short Term Goals -  10/20/17 1622      PT SHORT TERM GOAL #1   Title  Pt Rt UE motion to improve to allow pt to easily wash and style her hair.    Time  3    Period  Weeks    Status  Achieved      PT SHORT TERM GOAL #2   Title  PT pain to be no greater than a 4/10 to allow pt to have a full night sleep     Time  3    Period  Weeks    Status  Partially Met      PT SHORT TERM GOAL #3   Title  Pt to be able to reach up to her seatbelt and buckle it without difficulty     Time  3    Period  Weeks    Status  Achieved      PT SHORT TERM GOAL #4   Title  Pt to be completing HEP in order to increase the motion of her right arm to allow the above to occur.    Status  Achieved        PT Long Term Goals - 10/20/17 1622      PT LONG TERM GOAL #1   Title  Pt Rt shoulder  ROM to have improved to allow pt to reach into her back pocket without difficulty     Time  6    Period  Weeks    Status  Partially Met      PT LONG TERM GOAL #2   Title  Pt Rt shoulder  ROM to have improved to allow pt to clasp her bra without difficulty     Time  6    Period  Weeks    Status  On-going      PT LONG TERM GOAL #3   Title  PT Rt shoulder ROM and strength to be increased to where pt can put her dishes  up into higher cabinets without difficulty     Time  6    Period  Weeks    Status  Partially Met      PT LONG TERM GOAL #4   Title  Pt pain to be no greater than a 1/10 throughout the day 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     Time  6    Period  Weeks    Status  On-going            Plan - 11/02/17 1334    Clinical Impression Statement  Pt states that she has not stretched as much as she has been due to being busy during the holidays.  PT ROM assessed with no significant change.     Rehab Potential  Good    PT Frequency  2x / week    PT Duration  6 weeks    PT Treatment/Interventions  ADLs/Self Care Home Management;Cryotherapy;Ultrasound;Iontophoresis 68m/ml Dexamethasone;Therapeutic activities;Therapeutic exercise;Patient/family education;Manual techniques;Passive range of motion    PT Next Visit Plan    Continue joint and scapular mobs, PROM .  Discontinue laser as pt has had 6 treatments.       PT Home Exercise Plan  eval: wand flexion, abduction, IR/ER.  Self joint capsule stretch for posterior, inferior and anterior capsule.        Patient will benefit from skilled therapeutic intervention in order to improve the following deficits and impairments:  Decreased activity tolerance, Decreased range of motion,  Decreased strength, Impaired flexibility, Impaired UE functional use, Pain  Visit Diagnosis: Stiffness of right shoulder, not elsewhere classified  Acute pain of right shoulder     Problem List There are no active problems to display for this patient.  Rayetta Humphrey, PT CLT [phone] 11/03/2017, 1:37 PM  [facility] [address] [place], [place], [address] Phone: [phone]   Fax:  [phone]  Name: Diane Wolf MRN: [id number] Date of Birth: 11-08-1965

## 2017-11-09 ENCOUNTER — Ambulatory Visit (HOSPITAL_COMMUNITY): Payer: Worker's Compensation | Admitting: Physical Therapy

## 2017-11-09 DIAGNOSIS — M25611 Stiffness of right shoulder, not elsewhere classified: Secondary | ICD-10-CM | POA: Diagnosis not present

## 2017-11-09 DIAGNOSIS — M25511 Pain in right shoulder: Secondary | ICD-10-CM

## 2017-11-09 NOTE — Therapy (Signed)
[place], [place], [address] Phone: [phone]   Fax:  [phone]  Physical Therapy Treatment  Patient Details  Name: Diane Wolf MRN: [id number] Date of Birth: February 11, 1966 Referring Provider: Sydnee Cabal    Encounter Date: 11/09/2017  PT End of Session - 11/09/17 1754    Visit Number  13    Number of Visits  16    Date for PT Re-Evaluation  12/16/17    Authorization Type  UHC    Authorization - Visit Number  13    Authorization - Number of Visits  16    PT Start Time  3875    PT Stop Time  1735    PT Time Calculation (min)  45 min    Activity Tolerance  Patient tolerated treatment well;No increased pain    Behavior During Therapy  [facility] for tasks assessed/performed       No past medical history on file.  Past Surgical History:  Procedure Laterality Date  . CHOLECYSTECTOMY      There were no vitals filed for this visit.  Subjective Assessment - 11/09/17 1713    Subjective  pt states she has slept through the night for the past 3 nights.  Reports overall improvement without pain currently.     Currently in Pain?  No/denies                      [facility] Adult PT Treatment/Exercise - 11/09/17 0001      Exercises   Exercises  Shoulder      Shoulder Exercises: Supine   External Rotation  PROM;AROM;5 reps    Internal Rotation  PROM;AROM;5 reps    Flexion  PROM;AROM;5 reps    ABduction  PROM;AROM      Shoulder Exercises: Standing   External Rotation  Strengthening;10 reps    Flexion  Strengthening;Both;10 reps    ABduction  Strengthening;Both;10 reps    Other Standing Exercises  wall arch x 10      Shoulder Exercises: Pulleys   Flexion  2 minutes    ABduction  2 minutes      Shoulder Exercises: ROM/Strengthening   UBE (Upper Arm Bike)  3 minutes backward L1      Manual Therapy   Manual Therapy  Myofascial release;Soft tissue mobilization    Manual therapy comments  done  seperate from all other aspects of treatment     Joint Mobilization  scapular and shoulder     Soft tissue mobilization  to decrease spasms in bicep region    Myofascial Release  to decrease adhesions in proximal UE    Passive ROM  Rt shoulder ER/IR, flexion and abduction               PT Short Term Goals - 10/20/17 1622      PT SHORT TERM GOAL #1   Title  Pt Rt UE motion to improve to allow pt to easily wash and style her hair.    Time  3    Period  Weeks    Status  Achieved      PT SHORT TERM GOAL #2   Title  PT pain to be no greater than a 4/10 to allow pt to have a full night sleep     Time  3    Period  Weeks    Status  Partially Met      PT SHORT TERM GOAL #3  Title  Pt to be able to reach up to her seatbelt and buckle it without difficulty     Time  3    Period  Weeks    Status  Achieved      PT SHORT TERM GOAL #4   Title  Pt to be completing HEP in order to increase the motion of her right arm to allow the above to occur.    Status  Achieved        PT Long Term Goals - 10/20/17 1622      PT LONG TERM GOAL #1   Title  Pt Rt shoulder  ROM to have improved to allow pt to reach into her back pocket without difficulty     Time  6    Period  Weeks    Status  Partially Met      PT LONG TERM GOAL #2   Title  Pt Rt shoulder  ROM to have improved to allow pt to clasp her bra without difficulty     Time  6    Period  Weeks    Status  On-going      PT LONG TERM GOAL #3   Title  PT Rt shoulder ROM and strength to be increased to where pt can put her dishes up into higher cabinets without difficulty     Time  6    Period  Weeks    Status  Partially Met      PT LONG TERM GOAL #4   Title  Pt pain to be no greater than a 1/10 throughout the day 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     Time  6    Period  Weeks    Status  On-going            Plan - 11/09/17 1755    Clinical Impression Statement  Continued with primary  focus on improving ROM of all Rt shoulder motions.  Pt with improved awareness of substitutions and how to correct these.  PROM completed with improved tolerance and increased ROM achieved.      Rehab Potential  Good    PT Frequency  2x / week    PT Duration  6 weeks    PT Treatment/Interventions  ADLs/Self Care Home Management;Cryotherapy;Ultrasound;Iontophoresis 29m/ml Dexamethasone;Therapeutic activities;Therapeutic exercise;Patient/family education;Manual techniques;Passive range of motion    PT Next Visit Plan    Continue mobilizations, myofascial techiques and PROM to increase ROM of Rt shoulder.      PT Home Exercise Plan  eval: wand flexion, abduction, IR/ER.  Self joint capsule stretch for posterior, inferior and anterior capsule.        Patient will benefit from skilled therapeutic intervention in order to improve the following deficits and impairments:  Decreased activity tolerance, Decreased range of motion, Decreased strength, Impaired flexibility, Impaired UE functional use, Pain  Visit Diagnosis: Stiffness of right shoulder, not elsewhere classified  Acute pain of right shoulder     Problem List There are no active problems to display for this patient.  AJASKIRAN PATA PTA/CLT [phone] FTeena Irani1/07/2018, 5:58 PM  [place] [address]Phone: [phone]  Fax:  [phone] Name: Diane JAYEMRN: [id number] of Birth: [birth year]

## 2017-11-10 ENCOUNTER — Ambulatory Visit (HOSPITAL_COMMUNITY): Payer: Worker's Compensation | Admitting: Physical Therapy

## 2017-11-10 DIAGNOSIS — M25511 Pain in right shoulder: Secondary | ICD-10-CM

## 2017-11-10 DIAGNOSIS — M25611 Stiffness of right shoulder, not elsewhere classified: Secondary | ICD-10-CM

## 2017-11-10 NOTE — Therapy (Signed)
[place], [place], [address] Phone: [phone]   Fax:  [phone]  Physical Therapy Treatment  Patient Details  Name: VIVECA BECKSTROM MRN: [id number] Date of Birth: 1966-02-21 Referring Provider: Sydnee Cabal    Encounter Date: 11/10/2017  PT End of Session - 11/10/17 1711    Visit Number  14    Number of Visits  16    Date for PT Re-Evaluation  12/16/17    Authorization Type  [employer]    Authorization - Visit Number  14    Authorization - Number of Visits  16    PT Start Time  3903    PT Stop Time  1730    PT Time Calculation (min)  40 min    Activity Tolerance  Patient tolerated treatment well;No increased pain    Behavior During Therapy  [facility] for tasks assessed/performed       No past medical history on file.  Past Surgical History:  Procedure Laterality Date  . CHOLECYSTECTOMY      There were no vitals filed for this visit.  Subjective Assessment - 11/10/17 1651    Subjective  Pt states she slept well until around 5am this morining and had to get up and put ice on her shoulder.  States she was able to fall back asleep after that.    Currently in Pain?  No/denies    Aggravating Factors   when stretching increases to 2/10 but otherwise no pain                      [facility] Adult PT Treatment/Exercise - 11/10/17 0001      Shoulder Exercises: Supine   External Rotation  PROM;AROM;5 reps    Internal Rotation  PROM;AROM;5 reps    Flexion  PROM;AROM;5 reps    ABduction  PROM;AROM      Shoulder Exercises: Standing   External Rotation  Strengthening;15 reps    Flexion  Strengthening;Both;15 reps    ABduction  Strengthening;Both;15 reps    Other Standing Exercises  wall arch x 15      Shoulder Exercises: Pulleys   Flexion  2 minutes    ABduction  2 minutes      Shoulder Exercises: ROM/Strengthening   UBE (Upper Arm Bike)  3 minutes backward L1      Manual Therapy   Manual Therapy   Myofascial release;Soft tissue mobilization    Manual therapy comments  done seperate from all other aspects of treatment     Joint Mobilization  scapular and shoulder     Soft tissue mobilization  to decrease spasms in bicep region    Myofascial Release  to decrease adhesions in proximal UE    Passive ROM  Rt shoulder ER/IR, flexion and abduction               PT Short Term Goals - 10/20/17 1622      PT SHORT TERM GOAL #1   Title  Pt Rt UE motion to improve to allow pt to easily wash and style her hair.    Time  3    Period  Weeks    Status  Achieved      PT SHORT TERM GOAL #2   Title  PT pain to be no greater than a 4/10 to allow pt to have a full night sleep     Time  3    Period  Weeks  Status  Partially Met      PT SHORT TERM GOAL #3   Title  Pt to be able to reach up to her seatbelt and buckle it without difficulty     Time  3    Period  Weeks    Status  Achieved      PT SHORT TERM GOAL #4   Title  Pt to be completing HEP in order to increase the motion of her right arm to allow the above to occur.    Status  Achieved        PT Long Term Goals - 10/20/17 1622      PT LONG TERM GOAL #1   Title  Pt Rt shoulder  ROM to have improved to allow pt to reach into her back pocket without difficulty     Time  6    Period  Weeks    Status  Partially Met      PT LONG TERM GOAL #2   Title  Pt Rt shoulder  ROM to have improved to allow pt to clasp her bra without difficulty     Time  6    Period  Weeks    Status  On-going      PT LONG TERM GOAL #3   Title  PT Rt shoulder ROM and strength to be increased to where pt can put her dishes up into higher cabinets without difficulty     Time  6    Period  Weeks    Status  Partially Met      PT LONG TERM GOAL #4   Title  Pt pain to be no greater than a 1/10 throughout the day 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     Time  6    Period  Weeks    Status  On-going             Plan - 11/10/17 1712    Clinical Impression Statement  Continued with increasing ROM of Rt shoulder.  Noted stiffness with AROM exercises but able to decrease with manual techniques and PROM. Spent large amount of time completing myofascial techniques spasms in upper arm.   Pt with noted improvement following session    Rehab Potential  Good    PT Frequency  2x / week    PT Duration  6 weeks    PT Treatment/Interventions  ADLs/Self Care Home Management;Cryotherapy;Ultrasound;Iontophoresis 58m/ml Dexamethasone;Therapeutic activities;Therapeutic exercise;Patient/family education;Manual techniques;Passive range of motion    PT Next Visit Plan    Continue mobilizations, myofascial techiques and PROM to increase ROM of Rt shoulder.      PT Home Exercise Plan  eval: wand flexion, abduction, IR/ER.  Self joint capsule stretch for posterior, inferior and anterior capsule.        Patient will benefit from skilled therapeutic intervention in order to improve the following deficits and impairments:  Decreased activity tolerance, Decreased range of motion, Decreased strength, Impaired flexibility, Impaired UE functional use, Pain  Visit Diagnosis: Stiffness of right shoulder, not elsewhere classified  Acute pain of right shoulder     Problem List There are no active problems to display for this patient.  ACHAYAH MCKEE PTA/CLT [phone] FTeena Irani1/08/2018, 5:37 PM  CL[place] CourtSFrederic [place] [address]Phone: [phone]  Fax:  [phone] Name: AMAKENSEY REGOMRN: [id number] of Birth: 1March 24, 1967

## 2017-11-15 ENCOUNTER — Encounter (HOSPITAL_COMMUNITY): Payer: Self-pay

## 2017-11-15 ENCOUNTER — Other Ambulatory Visit: Payer: Self-pay

## 2017-11-15 ENCOUNTER — Ambulatory Visit (HOSPITAL_COMMUNITY): Payer: Worker's Compensation

## 2017-11-15 DIAGNOSIS — M25611 Stiffness of right shoulder, not elsewhere classified: Secondary | ICD-10-CM

## 2017-11-15 DIAGNOSIS — M25511 Pain in right shoulder: Secondary | ICD-10-CM

## 2017-11-15 NOTE — Patient Instructions (Signed)
   Reverse push up: 10 times for 10 seconds each  Stand neutral against the wall. Place the elbows against the wall, and push yourself away from the wall while using the mid back muscles. Keep shoulders down and back.   You will fatigue on mid back muscles (rhomboids, middle trapezius) and stretch the front of shoulders (pectoralis and anterior deltoid). Make sure to brace deep core muscles (transversus abdominis) and keep pelvis porteriorly tucked.

## 2017-11-15 NOTE — Therapy (Signed)
[place], [place], [address] Phone: [phone]   Fax:  [phone]  Physical Therapy Treatment/Re-Assessment  Patient Details  Name: Diane Wolf MRN: [id number] Date of Birth: 1966/05/27 Referring Provider: Sydnee Cabal   Encounter Date: 11/15/2017  PT End of Session - 11/15/17 1632    Visit Number  15    Number of Visits  23    Date for PT Re-Evaluation  12/06/17    Authorization Type  UHC    Authorization Time Period  11/15/17 - 12/09/17    Authorization - Visit Number  --    Authorization - Number of Visits  --    PT Start Time  1616    PT Stop Time  1700    PT Time Calculation (min)  44 min    Activity Tolerance  Patient tolerated treatment well;No increased pain    Behavior During Therapy  [facility] for tasks assessed/performed       History reviewed. No pertinent past medical history.  Past Surgical History:  Procedure Laterality Date  . CHOLECYSTECTOMY      There were no vitals filed for this visit.  Subjective Assessment - 11/15/17 1620    Subjective  Patient reports she had the flu last week and her last symptoms were on Sunday 11/13/17, she experienced fever and vomiting. She reports she has had no symptoms since roughly 6 PM on Sunday. She is just outside the 48-hour window.  She also reports she has been able to sleep through almost the entire night without waking up to get an ice pack during the last week. She reports her HEP is going well but that she is unable to perform some of the stretches.     Limitations  Lifting;House hold activities    Patient Stated Goals  no pain with activity     Currently in Pain?  No/denies         [facility] PT Assessment - 11/15/17 0001      Assessment   Medical Diagnosis  Rt shoulder impingement    Referring Provider  Sydnee Cabal    Onset Date/Surgical Date  01/13/17    Next MD Visit  11/30/2017    Prior Therapy  none      Precautions   Precautions  None       Restrictions   Weight Bearing Restrictions  No      Prior Function   Level of Independence  Independent      Observation/Other Assessments   Focus on Therapeutic Outcomes (FOTO)   37% limited 40% limited on 09/16/17      AROM   Right Shoulder Flexion  155 Degrees    Right Shoulder ABduction  135 Degrees    Right Shoulder Internal Rotation  67 Degrees    Right Shoulder External Rotation  57 Degrees      Strength   Right Shoulder Flexion  5/5    Right Shoulder Extension  4+/5    Right Shoulder ABduction  4+/5    Right Shoulder Internal Rotation  5/5    Right Shoulder External Rotation  4+/5 painful    Right Shoulder Horizontal ABduction  5/5    Right Shoulder Horizontal ADduction  5/5       [facility] Adult PT Treatment/Exercise - 11/15/17 0001      Shoulder Exercises: Supine   External Rotation  AROM;Right;10 reps    Internal Rotation  AROM;Right;10 reps    Flexion  AROM;Both;10 reps  ABduction  AROM;10 reps;Right      Shoulder Exercises: Standing   Other Standing Exercises  wall arch x 15    Other Standing Exercises  reverse wall push up: for anterior capsule stretch, 10x 10 second holds      Shoulder Exercises: Pulleys   Flexion  2 minutes    ABduction  2 minutes        PT Education - 11/15/17 1720    Education provided  Yes    Education Details  Educated onprogress made towards goals and on improtance of HEP compliance. Discussed continuing POC for 3 additional weeks.    Person(s) Educated  Patient    Methods  Explanation;Demonstration;Handout    Comprehension  Verbalized understanding;Returned demonstration       PT Short Term Goals - 11/15/17 1634      PT SHORT TERM GOAL #1   Title  Pt Rt UE motion to improve to allow pt to easily wash and style her hair.    Time  3    Period  Weeks    Status  Achieved      PT SHORT TERM GOAL #2   Title  PT pain to be no greater than a 4/10 to allow pt to have a full night sleep     Baseline  patient reports pain  of 5/10 ith stretching, can sleeep through night     Time  3    Period  Weeks    Status  Partially Met    Target Date  12/06/17      PT SHORT TERM GOAL #3   Title  Pt to be able to reach up to her seatbelt and buckle it without difficulty     Time  3    Period  Weeks    Status  Achieved      PT SHORT TERM GOAL #4   Title  Pt to be completing HEP in order to increase the motion of her right arm to allow the above to occur.    Status  Achieved        PT Long Term Goals - 11/15/17 1636      PT LONG TERM GOAL #1   Title  Pt Rt shoulder  ROM to have improved to allow pt to reach into her back pocket without difficulty     Time  3    Period  Weeks    Status  Partially Met    Target Date  12/06/17      PT LONG TERM GOAL #2   Title  Pt Rt shoulder  ROM to have improved to allow pt to clasp her bra without difficulty. Patient will have equal height of BUE combined shuolder movement for extension/adduction/internal rotation.    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     Baseline  11/15/17 - max of 5/10    Time  3    Period  Weeks    Status  On-going    Target Date  12/06/17        Plan - 11/15/17 1723    Clinical Impression Statement  Re-assessment performed today and patient has partially met/achieved 4/4 short term goals, and 2/4 long term goals. She is making excellent progress and has made significant  improvements in right shoulder ROM and strength. She continues to have greatest limitations with shoulder elevation above 160 degrees and shoulder internal rotation. Overall patient is responding well to manual therapy and therapeutic exercises and has continued to improve. She will benefit from 3 additional weeks of skilled PT services to achieve remaining goals, reduce pain and improve QOL.    Rehab Potential  Good    PT Frequency  2x / week    PT Duration  3 weeks    PT Treatment/Interventions  ADLs/Self Care Home Management;Cryotherapy;Ultrasound;Iontophoresis 76m/ml Dexamethasone;Therapeutic activities;Therapeutic exercise;Patient/family education;Manual techniques;Passive range of motion;Neuromuscular re-education    PT Next Visit Plan    Continue joint mobilizations wiht focus on anterior, inferior glenohumeral glide. Continue myofascial techiques and PROM to increase ROM of Rt shoulder.  Progress AROM exercises and stretches.     PT Home Exercise Plan  eval: wand flexion, abduction, IR/ER.  Self joint capsule stretch for posterior, inferior and anterior capsule. 11/15/17 - reverse wall pushup for anterior capsule stretch    Consulted and Agree with Plan of Care  Patient       Patient will benefit from skilled therapeutic intervention in order to improve the following deficits and impairments:  Decreased activity tolerance, Decreased range of motion, Decreased strength, Impaired flexibility, Impaired UE functional use, Pain, Hypomobility  Visit Diagnosis: Stiffness of right shoulder, not elsewhere classified  Acute pain of right shoulder     Problem List There are no active problems to display for this patient.    RKipp Brood PT, DPT Physical Therapist with [facility] 11/15/2017 5:38 PM    [place] [address]Phone: [phone]  Fax:  [phone] Name: ATEKIA WATERBURYMRN: [id number] of Birth: [birth year]

## 2017-11-17 ENCOUNTER — Ambulatory Visit (HOSPITAL_COMMUNITY): Payer: Worker's Compensation | Admitting: Physical Therapy

## 2017-11-17 DIAGNOSIS — M25611 Stiffness of right shoulder, not elsewhere classified: Secondary | ICD-10-CM | POA: Diagnosis not present

## 2017-11-17 DIAGNOSIS — M25511 Pain in right shoulder: Secondary | ICD-10-CM

## 2017-11-17 NOTE — Therapy (Signed)
[place] [address] [place], [place], [address] Phone: [phone]   Fax:  [phone]  Physical Therapy Treatment  Patient Details  Name: Diane Wolf MRN: [id number] Date of Birth: 07-Jun-1966 Referring Provider: Sydnee Cabal   Encounter Date: 11/17/2017  PT End of Session - 11/17/17 1654    Visit Number  16    Number of Visits  23    Date for PT Re-Evaluation  12/06/17    Authorization Type  UHC    Authorization Time Period  11/15/17 - 12/09/17    PT Start Time  1605    PT Stop Time  1650    PT Time Calculation (min)  45 min    Activity Tolerance  Patient tolerated treatment well;No increased pain    Behavior During Therapy  [facility] for tasks assessed/performed       No past medical history on file.  Past Surgical History:  Procedure Laterality Date  . CHOLECYSTECTOMY      There were no vitals filed for this visit.  Subjective Assessment - 11/17/17 1629    Subjective  Pt states she is doing well today.  Minimal pain of 2/10 in Rt arm at bicep area.    Currently in Pain?  Yes    Pain Score  2     Pain Location  Arm    Pain Orientation  Right    Pain Descriptors / Indicators  Sharp    Pain Type  Chronic pain                      [facility] Adult PT Treatment/Exercise - 11/17/17 0001      Shoulder Exercises: Supine   External Rotation  AROM;Right;10 reps    Internal Rotation  AROM;Right;10 reps    Flexion  AROM;Both;10 reps    ABduction  AROM;10 reps;Right      Shoulder Exercises: Standing   External Rotation  Strengthening;15 reps    Flexion  Strengthening;Both;15 reps    ABduction  Strengthening;Both;15 reps    Other Standing Exercises  wall arch x 15    Other Standing Exercises  reverse wall push up: for anterior capsule stretch, 10x 10 second holds      Shoulder Exercises: Pulleys   Flexion  2 minutes    ABduction  2 minutes      Shoulder Exercises: ROM/Strengthening   UBE (Upper Arm Bike)  3  minutes backward L1      Manual Therapy   Manual Therapy  Myofascial release;Soft tissue mobilization    Manual therapy comments  done seperate from all other aspects of treatment     Soft tissue mobilization  to decrease spasms in bicep region    Myofascial Release  to decrease adhesions in proximal UE    Passive ROM  Rt shoulder ER/IR, flexion and abduction               PT Short Term Goals - 11/15/17 1634      PT SHORT TERM GOAL #1   Title  Pt Rt UE motion to improve to allow pt to easily wash and style her hair.    Time  3    Period  Weeks    Status  Achieved      PT SHORT TERM GOAL #2   Title  PT pain to be no greater than a 4/10 to allow pt to have a full night sleep     Baseline  patient  reports pain of 5/10 ith stretching, can sleeep through night     Time  3    Period  Weeks    Status  Partially Met    Target Date  12/06/17      PT SHORT TERM GOAL #3   Title  Pt to be able to reach up to her seatbelt and buckle it without difficulty     Time  3    Period  Weeks    Status  Achieved      PT SHORT TERM GOAL #4   Title  Pt to be completing HEP in order to increase the motion of her right arm to allow the above to occur.    Status  Achieved        PT Long Term Goals - 11/15/17 1636      PT LONG TERM GOAL #1   Title  Pt Rt shoulder  ROM to have improved to allow pt to reach into her back pocket without difficulty     Time  3    Period  Weeks    Status  Partially Met    Target Date  12/06/17      PT LONG TERM GOAL #2   Title  Pt Rt shoulder  ROM to have improved to allow pt to clasp her bra without difficulty. Patient will have equal height of BUE combined shuolder movement for extension/adduction/internal rotation.    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     Baseline  11/15/17 - max of 5/10    Time  3    Period  Weeks    Status  On-going    Target Date  12/06/17            Plan - 11/17/17 1655    Clinical Impression Statement  contiued with focus on improving ROM of Rt shoulder.  Myofascial/soft tissue work completed prior to PROM with noted improvmeent in all directions.  Most tightness in elbow and deltoid region.  Pt tolerated all exercises and manual well with increased ROM before increased pain.      Rehab Potential  Good    PT Frequency  2x / week    PT Duration  3 weeks    PT Treatment/Interventions  ADLs/Self Care Home Management;Cryotherapy;Ultrasound;Iontophoresis 76m/ml Dexamethasone;Therapeutic activities;Therapeutic exercise;Patient/family education;Manual techniques;Passive range of motion;Neuromuscular re-education    PT Next Visit Plan    Continue joint mobilizations with focus on anterior, inferior glenohumeral glide. Continue myofascial techiques and PROM to increase ROM of Rt shoulder.  Progress AROM exercises and stretches.     PT Home Exercise Plan  eval: wand flexion,  abduction, IR/ER.  Self joint capsule stretch for posterior, inferior and anterior capsule. 11/15/17 - reverse wall pushup for anterior capsule stretch    Consulted and Agree with Plan of Care  Patient       Patient will benefit from skilled therapeutic intervention in order to improve the following deficits and impairments:  Decreased activity tolerance, Decreased range of motion, Decreased strength, Impaired flexibility, Impaired UE functional use, Pain, Hypomobility  Visit  Diagnosis: Stiffness of right shoulder, not elsewhere classified  Acute pain of right shoulder     Problem List There are no active problems to display for this patient.  NEVELYN MELLOTT, PTA/CLT [phone]  Teena Irani 11/17/2017, 4:58 PM  [place] [address] [place], [place], [address] Phone: [phone]   Fax:  [phone]  Name: Diane Wolf MRN: [id number] Date of Birth: 1966-05-26

## 2017-11-22 ENCOUNTER — Ambulatory Visit (HOSPITAL_COMMUNITY): Payer: Worker's Compensation | Admitting: Physical Therapy

## 2017-11-22 DIAGNOSIS — M25611 Stiffness of right shoulder, not elsewhere classified: Secondary | ICD-10-CM

## 2017-11-22 DIAGNOSIS — M25511 Pain in right shoulder: Secondary | ICD-10-CM

## 2017-11-22 NOTE — Therapy (Signed)
[facility] [place] [place], [place], [address] Phone: [phone]   Fax:  [phone]  Physical Therapy Treatment  Patient Details  Name: Diane Wolf MRN: [id number] Date of Birth: January 03, 1966 Referring Provider: Sydnee Cabal   Encounter Date: 11/22/2017  PT End of Session - 11/22/17 1644    Visit Number  17    Number of Visits  23    Date for PT Re-Evaluation  12/06/17    Authorization Type  UHC    Authorization Time Period  11/15/17 - 12/09/17    PT Start Time  1520    PT Stop Time  1600    PT Time Calculation (min)  40 min    Activity Tolerance  Patient tolerated treatment well;No increased pain    Behavior During Therapy  [facility] for tasks assessed/performed       No past medical history on file.  Past Surgical History:  Procedure Laterality Date  . CHOLECYSTECTOMY      There were no vitals filed for this visit.  Subjective Assessment - 11/22/17 1537    Subjective  Pt reports minimal pain and overall doing well.     Currently in Pain?  Yes    Pain Score  2     Pain Location  Arm    Pain Orientation  Right    Pain Descriptors / Indicators  Sharp    Pain Type  Chronic pain                      [facility] Adult PT Treatment/Exercise - 11/22/17 0001      Shoulder Exercises: Supine   External Rotation  AROM;Right;10 reps    Internal Rotation  AROM;Right;10 reps    Flexion  AROM;Both;10 reps    ABduction  AROM;10 reps;Right      Shoulder Exercises: Standing   External Rotation  Strengthening;15 reps    Flexion  Strengthening;Both;15 reps    ABduction  Strengthening;Both;15 reps    Other Standing Exercises  wall arch x 15    Other Standing Exercises  reverse wall push up: for anterior capsule stretch, 10x 10 second holds      Shoulder Exercises: Pulleys   Flexion  2 minutes    ABduction  2 minutes      Shoulder Exercises: ROM/Strengthening   UBE (Upper Arm Bike)  3 minutes backward L1      Manual  Therapy   Manual Therapy  Myofascial release;Soft tissue mobilization    Manual therapy comments  done seperate from all other aspects of treatment     Soft tissue mobilization  to decrease spasms in bicep region    Myofascial Release  to decrease adhesions in proximal UE    Passive ROM  Rt shoulder ER/IR, flexion and abduction               PT Short Term Goals - 11/15/17 1634      PT SHORT TERM GOAL #1   Title  Pt Rt UE motion to improve to allow pt to easily wash and style her hair.    Time  3    Period  Weeks    Status  Achieved      PT SHORT TERM GOAL #2   Title  PT pain to be no greater than a 4/10 to allow pt to have a full night sleep     Baseline  patient reports pain of 5/10 ith stretching, can sleeep through  night     Time  3    Period  Weeks    Status  Partially Met    Target Date  12/06/17      PT SHORT TERM GOAL #3   Title  Pt to be able to reach up to her seatbelt and buckle it without difficulty     Time  3    Period  Weeks    Status  Achieved      PT SHORT TERM GOAL #4   Title  Pt to be completing HEP in order to increase the motion of her right arm to allow the above to occur.    Status  Achieved        PT Long Term Goals - 11/15/17 1636      PT LONG TERM GOAL #1   Title  Pt Rt shoulder  ROM to have improved to allow pt to reach into her back pocket without difficulty     Time  3    Period  Weeks    Status  Partially Met    Target Date  12/06/17      PT LONG TERM GOAL #2   Title  Pt Rt shoulder  ROM to have improved to allow pt to clasp her bra without difficulty. Patient will have equal height of BUE combined shuolder movement for extension/adduction/internal rotation.    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     Baseline  11/15/17 - max of 5/10    Time  3    Period  Weeks    Status  On-going    Target Date  12/06/17            Plan - 11/22/17 1645    Clinical Impression Statement  Continued with ROM and manual to Rt shoulder.  Myofascial completed prior to PROM to help achieve improved ROM.  No c/o increased pain at EOS, improved tolerance of PROM today.    Rehab Potential  Good    PT Frequency  2x / week    PT Duration  3 weeks    PT Treatment/Interventions  ADLs/Self Care Home Management;Cryotherapy;Ultrasound;Iontophoresis 22m/ml Dexamethasone;Therapeutic activities;Therapeutic exercise;Patient/family education;Manual techniques;Passive range of motion;Neuromuscular re-education    PT Next Visit Plan    Continue joint mobilizations with focus on anterior, inferior glenohumeral glide. Continue myofascial techiques and PROM to increase ROM of Rt shoulder.  Progress AROM exercises and stretches.     PT Home Exercise Plan  eval: wand flexion, abduction, IR/ER.  Self joint capsule stretch for posterior, inferior and anterior capsule. 11/15/17 - reverse wall pushup for anterior capsule stretch  Consulted and Agree with Plan of Care  Patient       Patient will benefit from skilled therapeutic intervention in order to improve the following deficits and impairments:  Decreased activity tolerance, Decreased range of motion, Decreased strength, Impaired flexibility, Impaired UE functional use, Pain, Hypomobility  Visit Diagnosis: Stiffness of right shoulder, not elsewhere classified  Acute pain of right shoulder     Problem List There are no active  problems to display for this patient.  ALESIA OSHIELDS, PTA/CLT [phone]  Teena Irani 11/22/2017, 4:48 PM  [facility] [address] [place], [place], [address] Phone: [phone]   Fax:  [phone]  Name: ORILLA TEMPLEMAN MRN: [id number] Date of Birth: [birth year]

## 2017-11-24 ENCOUNTER — Ambulatory Visit (HOSPITAL_COMMUNITY): Payer: Worker's Compensation

## 2017-11-24 ENCOUNTER — Encounter (HOSPITAL_COMMUNITY): Payer: Self-pay

## 2017-11-24 DIAGNOSIS — M25611 Stiffness of right shoulder, not elsewhere classified: Secondary | ICD-10-CM | POA: Diagnosis not present

## 2017-11-24 DIAGNOSIS — M25511 Pain in right shoulder: Secondary | ICD-10-CM

## 2017-11-24 NOTE — Therapy (Signed)
[place] [place], [place], [address] Phone: [phone]   Fax:  [phone]  Physical Therapy Treatment  Patient Details  Name: Diane Wolf MRN: [id number] Date of Birth: 1966-07-26 Referring Provider: Sydnee Cabal   Encounter Date: 11/24/2017  PT End of Session - 11/24/17 1612    Visit Number  18    Number of Visits  23    Date for PT Re-Evaluation  12/06/17    Authorization Type  UHC    Authorization Time Period  11/15/17 - 12/09/17    PT Start Time  1604    PT Stop Time  1648    PT Time Calculation (min)  44 min    Activity Tolerance  Patient tolerated treatment well;No increased pain    Behavior During Therapy  [facility] for tasks assessed/performed       History reviewed. No pertinent past medical history.  Past Surgical History:  Procedure Laterality Date  . CHOLECYSTECTOMY      There were no vitals filed for this visit.  Subjective Assessment - 11/24/17 1610    Subjective  Pt stated she is feeling good today, able to sleep all night long.    Patient Stated Goals  no pain with activity     Currently in Pain?  No/denies                      [facility] Adult PT Treatment/Exercise - 11/24/17 0001      Shoulder Exercises: Prone   External Rotation  10 reps;AAROM    Internal Rotation  10 reps;AAROM      Shoulder Exercises: Standing   Other Standing Exercises  wall arch x 15    Other Standing Exercises  reverse wall push up: for anterior capsule stretch, 10x 10 second holds      Shoulder Exercises: Pulleys   Flexion  2 minutes standing with pillow behind to improve shoulder mobility    ABduction  2 minutes      Shoulder Exercises: ROM/Strengthening   UBE (Upper Arm Bike)  3 minutes backward L1      Manual Therapy   Manual Therapy  Joint mobilization;Myofascial release    Manual therapy comments  done seperate from all other aspects of treatment     Joint Mobilization  posterior and inferior  glides; scapula mobs in prone    Myofascial Release  to decrease adhesions in proximal UE    Passive ROM  Rt shoulder ER/IR, flexion and abduction               PT Short Term Goals - 11/15/17 1634      PT SHORT TERM GOAL #1   Title  Pt Rt UE motion to improve to allow pt to easily wash and style her hair.    Time  3    Period  Weeks    Status  Achieved      PT SHORT TERM GOAL #2   Title  PT pain to be no greater than a 4/10 to allow pt to have a full night sleep     Baseline  patient reports pain of 5/10 ith stretching, can sleeep through night     Time  3    Period  Weeks    Status  Partially Met    Target Date  12/06/17      PT SHORT TERM GOAL #3   Title  Pt to be able to reach up to  her seatbelt and buckle it without difficulty     Time  3    Period  Weeks    Status  Achieved      PT SHORT TERM GOAL #4   Title  Pt to be completing HEP in order to increase the motion of her right arm to allow the above to occur.    Status  Achieved        PT Long Term Goals - 11/15/17 1636      PT LONG TERM GOAL #1   Title  Pt Rt shoulder  ROM to have improved to allow pt to reach into her back pocket without difficulty     Time  3    Period  Weeks    Status  Partially Met    Target Date  12/06/17      PT LONG TERM GOAL #2   Title  Pt Rt shoulder  ROM to have improved to allow pt to clasp her bra without difficulty. Patient will have equal height of BUE combined shuolder movement for extension/adduction/internal rotation.    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     Baseline  11/15/17 - max of 5/10    Time  3    Period  Weeks    Status  On-going    Target Date  12/06/17            Plan - 11/24/17 1649    Clinical Impression Statement  Continued session focus on shoulder mobility with therex and manual technqiues. Noted improved range following joint mobs.  No reports of pain through session.     Rehab Potential  Good    PT Frequency  2x / week    PT Duration  3 weeks    PT Treatment/Interventions  ADLs/Self Care Home Management;Cryotherapy;Ultrasound;Iontophoresis 37m/ml Dexamethasone;Therapeutic activities;Therapeutic exercise;Patient/family education;Manual techniques;Passive range of motion;Neuromuscular re-education    PT Next Visit Plan    Continue joint mobilizations with focus on anterior, inferior glenohumeral glide. Continue myofascial techiques and PROM to increase ROM of Rt shoulder.  Progress AROM exercises and stretches.     PT Home Exercise Plan  eval: wand flexion, abduction, IR/ER.  Self joint capsule stretch for posterior, inferior and anterior capsule. 11/15/17 - reverse wall pushup for anterior capsule stretch       Patient will benefit from skilled therapeutic intervention in order to improve the following deficits and impairments:  Decreased activity tolerance, Decreased range of motion, Decreased strength, Impaired flexibility, Impaired UE functional use, Pain, Hypomobility  Visit Diagnosis: Stiffness of right shoulder, not elsewhere classified  Acute pain of right shoulder  Problem List There are no active problems to display for this patient.  [address], LPTA; [place]  Aldona Lento 11/24/2017, 6:06 PM  [facility] [address] [place], [place], [address] Phone:  [phone]   Fax:  [phone]  Name: Diane Wolf MRN: [id number] Date of Birth: 1966/08/15

## 2017-11-29 ENCOUNTER — Encounter (HOSPITAL_COMMUNITY): Payer: Self-pay

## 2017-11-29 ENCOUNTER — Ambulatory Visit (HOSPITAL_COMMUNITY): Payer: Worker's Compensation

## 2017-11-29 DIAGNOSIS — M25611 Stiffness of right shoulder, not elsewhere classified: Secondary | ICD-10-CM | POA: Diagnosis not present

## 2017-11-29 DIAGNOSIS — M25511 Pain in right shoulder: Secondary | ICD-10-CM

## 2017-11-29 NOTE — Therapy (Signed)
[place], [place], [address] Phone: [phone]   Fax:  [phone]  Physical Therapy Treatment  Patient Details  Name: Diane Wolf MRN: [id number] Date of Birth: November 01, 1966 Referring Provider: Sydnee Cabal   Encounter Date: 11/29/2017  PT End of Session - 11/29/17 1739    Visit Number  19    Number of Visits  23    Date for PT Re-Evaluation  12/06/17    Authorization Type  UHC    Authorization Time Period  11/15/17 - 12/09/17    Authorization - Visit Number  19    PT Start Time  0623 7' on UBE, not included in charges    PT Stop Time  1723    PT Time Calculation (min)  48 min    Activity Tolerance  Patient tolerated treatment well;No increased pain    Behavior During Therapy  [facility] for tasks assessed/performed       History reviewed. No pertinent past medical history.  Past Surgical History:  Procedure Laterality Date  . CHOLECYSTECTOMY      There were no vitals filed for this visit.  Subjective Assessment - 11/29/17 1741    Subjective  No reports of current pain, does c/o of stiffness and difficulty sleeping the last several nights.    Patient Stated Goals  no pain with activity     Currently in Pain?  No/denies         [facility] PT Assessment - 11/29/17 0001      Assessment   Medical Diagnosis  Rt shoulder impingement    Referring Provider  Sydnee Cabal    Onset Date/Surgical Date  01/13/17    Next MD Visit  11/30/2017    Prior Therapy  none      ROM / Strength   AROM / PROM / Strength  AROM      AROM   Overall AROM Comments  supine     AROM Assessment Site  Shoulder    Right/Left Shoulder  Right    Right Shoulder Flexion  161 Degrees was 155    Right Shoulder ABduction  145 Degrees was 135    Right Shoulder Internal Rotation  70 Degrees was 67    Right Shoulder External Rotation  54 Degrees Able to touch Rt buttcheek, not pocket or opposite sidwas 57                  [facility]  Adult PT Treatment/Exercise - 11/29/17 0001      Shoulder Exercises: Sidelying   External Rotation  10 reps;AAROM      Shoulder Exercises: Standing   Other Standing Exercises  wall arch x 15    Other Standing Exercises  reverse wall push up: for anterior capsule stretch, 2 sets10x 10 second holds; standing ER at wall with towel between elbow and side      Shoulder Exercises: Pulleys   Flexion  2 minutes    ABduction  2 minutes scaption      Shoulder Exercises: ROM/Strengthening   UBE (Upper Arm Bike)  3 minutes backward L1      Manual Therapy   Manual Therapy  Joint mobilization    Manual therapy comments  done seperate from all other aspects of treatment     Joint Mobilization  posterior and inferior glides; scapula mobs in prone    Passive ROM  Rt shoulder ER/IR, flexion and abduction  PT Short Term Goals - 11/15/17 1634      PT SHORT TERM GOAL #1   Title  Pt Rt UE motion to improve to allow pt to easily wash and style her hair.    Time  3    Period  Weeks    Status  Achieved      PT SHORT TERM GOAL #2   Title  PT pain to be no greater than a 4/10 to allow pt to have a full night sleep     Baseline  patient reports pain of 5/10 ith stretching, can sleeep through night     Time  3    Period  Weeks    Status  Partially Met    Target Date  12/06/17      PT SHORT TERM GOAL #3   Title  Pt to be able to reach up to her seatbelt and buckle it without difficulty     Time  3    Period  Weeks    Status  Achieved      PT SHORT TERM GOAL #4   Title  Pt to be completing HEP in order to increase the motion of her right arm to allow the above to occur.    Status  Achieved        PT Long Term Goals - 11/29/17 1743      PT LONG TERM GOAL #1   Title  Pt Rt shoulder  ROM to have improved to allow pt to reach into her back pocket without difficulty     Baseline  11/29/2017:  Able to touch butt, continues to be unable to reach into back pocket    Status   On-going      PT LONG TERM GOAL #2   Title  Pt Rt shoulder  ROM to have improved to allow pt to clasp her bra without difficulty. Patient will have equal height of BUE combined shuolder movement for extension/adduction/internal rotation.    Baseline  11/29/2017.  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            Plan - 11/29/17 1747    Clinical Impression Statement  Continue session focus with shoulder mobility with therex and manual technquires to improve joint mobility.  ROM measurement and goals reviewed prior MD apt.  Pt continues to make progress though does have limited range all directions especially with ER/IR.  No reports of pain through session.      Rehab Potential  Good    PT Frequency  2x / week    PT Duration  3 weeks    PT Treatment/Interventions  ADLs/Self Care Home Management;Cryotherapy;Ultrasound;Iontophoresis 59m/ml Dexamethasone;Therapeutic activities;Therapeutic exercise;Patient/family education;Manual techniques;Passive range of motion;Neuromuscular re-education  PT Next Visit Plan  F/U with MD.  Continue joint mobilizations with focus on anterior, inferior glenohumeral glide. Continue myofascial techiques and PROM to increase ROM of Rt shoulder.  Progress AROM exercises and stretches.     PT Home Exercise Plan  eval: wand flexion, abduction, IR/ER.  Self joint capsule stretch for  posterior, inferior and anterior capsule. 11/15/17 - reverse wall pushup for anterior capsule stretch       Patient will benefit from skilled therapeutic intervention in order to improve the following deficits and impairments:  Decreased activity tolerance, Decreased range of motion, Decreased strength, Impaired flexibility, Impaired UE functional use, Pain, Hypomobility  Visit Diagnosis: Stiffness of right shoulder, not elsewhere classified  Acute pain of right shoulder     Problem List There are no active problems to display for this patient.  [address], LPTA; [place]  Aldona Lento 11/29/2017, 6:32 PM  [facility] [address] [place], [place], [address] Phone: [phone]   Fax:  [phone]  Name: HODAN WURTZ MRN: [id number] Date of Birth: 05/29/1966

## 2017-12-01 ENCOUNTER — Ambulatory Visit (HOSPITAL_COMMUNITY): Payer: Worker's Compensation | Admitting: Physical Therapy

## 2017-12-01 DIAGNOSIS — M25511 Pain in right shoulder: Secondary | ICD-10-CM

## 2017-12-01 DIAGNOSIS — M25611 Stiffness of right shoulder, not elsewhere classified: Secondary | ICD-10-CM | POA: Diagnosis not present

## 2017-12-01 NOTE — Therapy (Signed)
[place], [place], [address] Phone: [phone]   Fax:  [phone]  Physical Therapy Treatment  Patient Details  Name: Diane Wolf MRN: [id number] Date of Birth: [birth year] Referring Provider: Sydnee Cabal   Encounter Date: 12/01/2017  PT End of Session - 12/01/17 1703    Visit Number  20    Number of Visits  23    Date for PT Re-Evaluation  12/06/17    Authorization Type  UHC    Authorization Time Period  11/15/17 - 12/09/17    Authorization - Visit Number  20    PT Start Time  4801    PT Stop Time  6553    PT Time Calculation (min)  46 min    Activity Tolerance  Patient tolerated treatment well;No increased pain    Behavior During Therapy  [facility] for tasks assessed/performed       No past medical history on file.  Past Surgical History:  Procedure Laterality Date  . CHOLECYSTECTOMY      There were no vitals filed for this visit.  Subjective Assessment - 12/01/17 1617    Subjective  Pt reports she is getting better.  Returns with continuation orders from MD without any new specific instructions.     Currently in Pain?  No/denies                      [facility] Adult PT Treatment/Exercise - 12/01/17 0001      Shoulder Exercises: Sidelying   External Rotation  10 reps;AAROM      Shoulder Exercises: Standing   Other Standing Exercises  wall arch x 15    Other Standing Exercises  reverse wall push up: for anterior capsule stretch, 2 sets10x 10 second holds; standing ER at wall with towel between elbow and side      Shoulder Exercises: Pulleys   Flexion  2 minutes    ABduction  2 minutes      Shoulder Exercises: ROM/Strengthening   UBE (Upper Arm Bike)  3 minutes backward L1      Manual Therapy   Manual Therapy  Joint mobilization;Myofascial release;Passive ROM    Manual therapy comments  done seperate from all other aspects of treatment     Joint Mobilization  posterior and inferior  glides; scapula mobs in prone    Myofascial Release  to decrease adhesions in proximal UE    Passive ROM  Rt shoulder ER/IR, flexion and abduction               PT Short Term Goals - 11/15/17 1634      PT SHORT TERM GOAL #1   Title  Pt Rt UE motion to improve to allow pt to easily wash and style her hair.    Time  3    Period  Weeks    Status  Achieved      PT SHORT TERM GOAL #2   Title  PT pain to be no greater than a 4/10 to allow pt to have a full night sleep     Baseline  patient reports pain of 5/10 ith stretching, can sleeep through night     Time  3    Period  Weeks    Status  Partially Met    Target Date  12/06/17      PT SHORT TERM GOAL #3   Title  Pt to be able to reach up to her seatbelt  and buckle it without difficulty     Time  3    Period  Weeks    Status  Achieved      PT SHORT TERM GOAL #4   Title  Pt to be completing HEP in order to increase the motion of her right arm to allow the above to occur.    Status  Achieved        PT Long Term Goals - 11/29/17 1743      PT LONG TERM GOAL #1   Title  Pt Rt shoulder  ROM to have improved to allow pt to reach into her back pocket without difficulty     Baseline  11/29/2017:  Able to touch butt, continues to be unable to reach into back pocket    Status  On-going      PT LONG TERM GOAL #2   Title  Pt Rt shoulder  ROM to have improved to allow pt to clasp her bra without difficulty. Patient will have equal height of BUE combined shuolder movement for extension/adduction/internal rotation.    Baseline  11/29/2017.  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            Plan - 12/01/17 1703    Clinical Impression Statement  Continued primary focus on improving shoulder mobiltiy through therex and manual techniques.  continues to have large spasm in subscapular region and general tightness perimeter.  PROM also completed to Rt shoulder in all directions.      Rehab Potential  Good    PT Frequency  2x / week    PT Duration  3 weeks    PT Treatment/Interventions  ADLs/Self Care Home Management;Cryotherapy;Ultrasound;Iontophoresis 55m/ml Dexamethasone;Therapeutic activities;Therapeutic exercise;Patient/family education;Manual techniques;Passive range of motion;Neuromuscular re-education    PT Next Visit Plan  continue joint mobilizations with focus on anterior, inferior glenohumeral glide. Continue myofascial techiques and PROM to increase ROM of Rt shoulder.  Progress AROM exercises and stretches.     PT Home Exercise Plan  eval: wand flexion, abduction, IR/ER.  Self joint capsule stretch for posterior, inferior and anterior capsule. 11/15/17 - reverse wall pushup for anterior capsule stretch       Patient will benefit from skilled therapeutic intervention in order to improve the following deficits and impairments:  Decreased activity tolerance, Decreased range of motion, Decreased strength, Impaired flexibility, Impaired UE functional use, Pain, Hypomobility  Visit Diagnosis: Stiffness of right shoulder, not elsewhere classified  Acute pain of right shoulder     Problem List There are no active problems to display for this patient.  AAKACIA BOLTZ PTA/CLT [phone] FTeena Irani1/31/2019, 5:08 PM  [place] Phone: [phone]   Fax:  [phone]  Name: Diane Wolf MRN: [id number] Date of Birth: [birth year]

## 2017-12-06 ENCOUNTER — Ambulatory Visit (HOSPITAL_COMMUNITY): Payer: Worker's Compensation | Attending: Specialist | Admitting: Physical Therapy

## 2017-12-06 DIAGNOSIS — M25511 Pain in right shoulder: Secondary | ICD-10-CM | POA: Diagnosis present

## 2017-12-06 DIAGNOSIS — M25611 Stiffness of right shoulder, not elsewhere classified: Secondary | ICD-10-CM | POA: Diagnosis present

## 2017-12-06 NOTE — Therapy (Signed)
[facility] [address] [place], [place], [address] Phone: [phone]   Fax:  [phone]  Physical Therapy Treatment  Patient Details  Name: Diane Wolf MRN: [id number] Date of Birth: Dec 26, 1965 Referring Provider: Sydnee Cabal   Encounter Date: 12/06/2017  PT End of Session - 12/06/17 1755    Visit Number  21    Number of Visits  23    Date for PT Re-Evaluation  12/06/17    Authorization Type  UHC    Authorization Time Period  11/15/17 - 12/09/17    PT Start Time  1652    PT Stop Time  1735    PT Time Calculation (min)  43 min    Activity Tolerance  Patient tolerated treatment well;No increased pain    Behavior During Therapy  [facility] for tasks assessed/performed       No past medical history on file.  Past Surgical History:  Procedure Laterality Date  . CHOLECYSTECTOMY      There were no vitals filed for this visit.  Subjective Assessment - 12/06/17 1754    Subjective  Pt reports she improves everyday.  Currently wtihout pain.  Working on her ROM everyday.                      [facility] Adult PT Treatment/Exercise - 12/06/17 1754      Shoulder Exercises: Sidelying   External Rotation  10 reps;AAROM      Shoulder Exercises: Standing   Other Standing Exercises  wall arch x 15    Other Standing Exercises  reverse wall push up: for anterior capsule stretch, 2 sets10x 10 second holds; standing ER at wall with towel between elbow and side      Shoulder Exercises: Pulleys   Flexion  2 minutes    ABduction  2 minutes      Shoulder Exercises: ROM/Strengthening   UBE (Upper Arm Bike)  3 minutes backward L1      Manual Therapy   Manual Therapy  Joint mobilization;Myofascial release;Passive ROM    Manual therapy comments  done seperate from all other aspects of treatment     Joint Mobilization  posterior and inferior glides; scapula mobs in prone    Myofascial Release  to decrease adhesions in proximal UE     Passive ROM  Rt shoulder ER/IR, flexion and abduction               PT Short Term Goals - 11/15/17 1634      PT SHORT TERM GOAL #1   Title  Pt Rt UE motion to improve to allow pt to easily wash and style her hair.    Time  3    Period  Weeks    Status  Achieved      PT SHORT TERM GOAL #2   Title  PT pain to be no greater than a 4/10 to allow pt to have a full night sleep     Baseline  patient reports pain of 5/10 ith stretching, can sleeep through night     Time  3    Period  Weeks    Status  Partially Met    Target Date  12/06/17      PT SHORT TERM GOAL #3   Title  Pt to be able to reach up to her seatbelt and buckle it without difficulty     Time  3    Period  Weeks  Status  Achieved      PT SHORT TERM GOAL #4   Title  Pt to be completing HEP in order to increase the motion of her right arm to allow the above to occur.    Status  Achieved        PT Long Term Goals - 11/29/17 1743      PT LONG TERM GOAL #1   Title  Pt Rt shoulder  ROM to have improved to allow pt to reach into her back pocket without difficulty     Baseline  11/29/2017:  Able to touch butt, continues to be unable to reach into back pocket    Status  On-going      PT LONG TERM GOAL #2   Title  Pt Rt shoulder  ROM to have improved to allow pt to clasp her bra without difficulty. Patient will have equal height of BUE combined shuolder movement for extension/adduction/internal rotation.    Baseline  11/29/2017.  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            Plan - 12/06/17 1755    Clinical Impression Statement  Noted improvement in ROM today with less restrictions palpated with manual .  Spasm posterior UE today resolved with Myofasscial techniques.      Rehab Potential  Good    PT Frequency  2x / week    PT Duration  3 weeks    PT Treatment/Interventions  ADLs/Self Care Home Management;Cryotherapy;Ultrasound;Iontophoresis 7m/ml Dexamethasone;Therapeutic activities;Therapeutic exercise;Patient/family education;Manual techniques;Passive range of motion;Neuromuscular re-education    PT Next Visit Plan  continue joint mobilizations with focus on anterior, inferior glenohumeral glide. Continue myofascial techiques and PROM to increase ROM of Rt shoulder.  Progress AROM exercises and stretches.   Cert ends on 2/8 will need recert/re-eval next visit.    PT Home Exercise Plan  eval: wand flexion, abduction, IR/ER.  Self joint capsule stretch for posterior, inferior and anterior capsule. 11/15/17 - reverse wall pushup for anterior capsule stretch       Patient will benefit from skilled therapeutic intervention in order to improve the following deficits and impairments:  Decreased activity tolerance, Decreased range of motion, Decreased strength, Impaired flexibility, Impaired UE functional use, Pain, Hypomobility  Visit Diagnosis: Stiffness of right shoulder, not elsewhere classified  Acute pain of right shoulder     Problem List There are no active problems to display for this patient.  AAMORIE RENTZ PTA/CLT [phone] FTeena Irani2/03/2018, 5:57 PM  [address]SWest [place] [address]Phone: [phone]  Fax:  [phone] Name: AKAMBRI DISMOREMRN: [id number] of Birth: 1June 16, 1967

## 2017-12-08 ENCOUNTER — Other Ambulatory Visit: Payer: Self-pay

## 2017-12-08 ENCOUNTER — Encounter (HOSPITAL_COMMUNITY): Payer: Self-pay

## 2017-12-08 ENCOUNTER — Ambulatory Visit (HOSPITAL_COMMUNITY): Payer: Worker's Compensation

## 2017-12-08 DIAGNOSIS — M25611 Stiffness of right shoulder, not elsewhere classified: Secondary | ICD-10-CM

## 2017-12-08 DIAGNOSIS — M25511 Pain in right shoulder: Secondary | ICD-10-CM

## 2017-12-08 NOTE — Therapy (Signed)
[facility] [place], [place], [address] Phone: [phone]   Fax:  [phone]  Physical Therapy Treatment/Re-Assessment  Patient Details  Name: Diane Wolf MRN: [id number] Date of Birth: Nov 13, 1965 Referring Provider: Sydnee Cabal   Encounter Date: 12/08/2017  PT End of Session - 12/08/17 1739    Visit Number  22    Number of Visits  29    Date for PT Re-Evaluation  12/06/17    Authorization Type  UHC    Authorization Time Period  12/08/17 - 01/19/18    PT Start Time  1651    PT Stop Time  1733    PT Time Calculation (min)  42 min    Activity Tolerance  Patient tolerated treatment well;No increased pain    Behavior During Therapy  [facility] for tasks assessed/performed       No past medical history on file.  Past Surgical History:  Procedure Laterality Date  . CHOLECYSTECTOMY      There were no vitals filed for this visit.  Subjective Assessment - 12/08/17 1655    Subjective  Patient had follow up with MD last week and he states she is improving well and to continue with therapy. She has a maximum pain of 4/10 and her pain is typically worse at night. She does not notice her right shoulder/arm pain during the day usually. 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 She would like to be able to throw the ball like she did before.    Limitations  Lifting;House hold activities    Patient Stated Goals  no pain with activity     Currently in Pain?  Yes    Pain Score  1     Pain Location  Arm    Pain Orientation  Right    Pain Descriptors / Indicators  Sharp    Pain Type  Chronic pain    Pain Onset  More than a month ago    Pain Frequency  Intermittent    Aggravating Factors   pain when layign on side at night    Pain Relieving Factors  unsure         [facility] PT Assessment - 12/08/17 0001      Assessment   Medical Diagnosis  Rt shoulder impingement    Referring Provider  Sydnee Cabal    Onset Date/Surgical Date  01/13/17    Next MD Visit  6 weeks froml ast week (March 16th)     Prior Therapy  none      Observation/Other Assessments   Focus on Therapeutic Outcomes (FOTO)   34% limited was 37% on 1/29      AROM   Overall AROM Comments  supine     Right Shoulder Flexion  165 Degrees 161 on 1/29    Right Shoulder ABduction  168 Degrees 145 on 1/29    Right Shoulder Internal Rotation  70 Degrees was 70 on 1/29    Right Shoulder External Rotation  65 Degrees was 54 on 1/29      Strength   Right Shoulder Flexion  5/5    Right Shoulder Extension  5/5    Right Shoulder ABduction  5/5    Right Shoulder Internal Rotation  5/5    Right Shoulder External Rotation  4+/5    Right Shoulder Horizontal ABduction  5/5  Right Shoulder Horizontal ADduction  5/5        [facility] Adult PT Treatment/Exercise - 12/08/17 0001      Shoulder Exercises: Standing   Other Standing Exercises  Ball toss 15 reps with light ball      Shoulder Exercises: Stretch   Wall Stretch - ABduction  --    Wall Stretch - ABduction Limitations  --    Table Stretch - Flexion  --    Table Stretch - Abduction  4 reps;30 seconds;Limitations    Table Stretch - ABduction Limitations  inferior glide to glenohumeral joint - self mobilization    Other Shoulder Stretches  Seated inferion glide with distraciton using contralatearl body lean, 4x 30 seconds for self mobilization    Other Shoulder Stretches  anterior glide self mobilization with shuolder extension seated, 4x 30 seconds      Manual Therapy   Manual Therapy  Joint mobilization;Myofascial release;Passive ROM    Manual therapy comments  done seperate from all other aspects of treatment     Joint Mobilization  posterior,inferior, and anterior right glenohumeral glides, grade III/IV, 3x 45 seconds each mobilization with oscilations       PT Education - 12/08/17 1737     Education provided  Yes    Education Details  Educated on progress towards goals and continuation of POC. Provided update on HEP with self mobilization to right glenohumeral joint. Will continue 1x per week for 6 weeks and re-assess at 3 weeks.     Person(s) Educated  Patient    Methods  Explanation;Handout;Demonstration    Comprehension  Verbalized understanding       PT Short Term Goals - 12/08/17 1653      PT SHORT TERM GOAL #1   Title  Pt Rt UE motion to improve to allow pt to easily wash and style her hair.    Time  3    Period  Weeks    Status  Achieved      PT SHORT TERM GOAL #2   Title  PT pain to be no greater than a 4/10 to allow pt to have a full night sleep     Baseline  12/08/17 - maximum pain of 4/10 and reports sleeping through the night, however her husband tells her she is restless at night and moves arm a lot    Time  3    Period  Weeks    Status  Achieved      PT SHORT TERM GOAL #3   Title  Pt to be able to reach up to her seatbelt and buckle it without difficulty     Time  3    Period  Weeks    Status  Achieved      PT SHORT TERM GOAL #4   Title  Pt to be completing HEP in order to increase the motion of her right arm to allow the above to occur.    Status  Achieved        PT Long Term Goals - 12/08/17 1657      PT LONG TERM GOAL #1   Title  Pt Rt shoulder  ROM to have improved to allow pt to reach into her back pocket without difficulty     Baseline  11/29/2017:  Able to touch butt, continues to be unable to reach into back pocket; 12/08/17 - able to reach into back pocket with no pain    Status  Achieved      PT  LONG TERM GOAL #2   Title  Pt Rt shoulder  ROM to have improved to allow pt to clasp her bra without difficulty. Patient will have equal height of BUE combined shuolder movement for extension/adduction/internal rotation.    Baseline  11/29/2017.  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    Time  6    Status  New    Target Date  01/19/18        Plan - 12/08/17 1745    Clinical Impression Statement  Re-assessment performed today and patient has achieved 4/4 short term goals, and 3/4 long term goals and created a 5th long term goal for return to recreational activities. She is making excellent progress and has made significant improvements in right shoulder ROM and strength. She continues to have greatest limitations with shoulder elevation above 165 degrees and shoulder external rotation. She has responded well to manual therapy and exercises and has continued to improve. Ms. Willison has been educated on and provided self-mobilization for her exercise and capsular tightness. She will benefit from 6 additional weeks of skilled PT services for 1visit per week to achieve remaining long term goal and new goal to return to recreation with work activities. She will be re-assessed in 3 weeks to determine potential for earlier discharge.    Rehab Potential  Good    PT Frequency  1x / week    PT Duration  6 weeks    PT Treatment/Interventions   ADLs/Self Care Home Management;Cryotherapy;Ultrasound;Iontophoresis 56m/ml Dexamethasone;Therapeutic activities;Therapeutic exercise;Patient/family education;Manual techniques;Passive range of motion;Neuromuscular re-education    PT Next Visit Plan  continue joint mobilizations with focus on anterior, inferior glenohumeral glide. Continue myofascial techiques and PROM to increase ROM of Rt shoulder.  Progress AROM exercises and stretches. Progress overhead throwing with RUE    PT Home Exercise Plan  eval: wand flexion, abduction, IR/ER.  Self joint capsule stretch for posterior, inferior and anterior capsule. 11/15/17 - reverse wall pushup for anterior capsule stretch; 12/08/17 - glenohumeral self mobilization inferior glide and anterior glide    Consulted and Agree with Plan of Care  Patient       Patient will benefit from skilled therapeutic intervention in order to improve the following deficits and impairments:  Decreased activity tolerance, Decreased range of motion, Decreased strength, Impaired flexibility,  Impaired UE functional use, Pain, Hypomobility  Visit Diagnosis: Stiffness of right shoulder, not elsewhere classified  Acute pain of right shoulder     Problem List There are no active problems to display for this patient.   Kipp Brood, PT, DPT Physical Therapist with [facility]  12/08/2017 6:05 PM    [facility] [address] [place], [place], [address] Phone: [phone]   Fax:  [phone]  Name: Diane Wolf MRN: [id number] Date of Birth: 12/29/1965

## 2017-12-08 NOTE — Patient Instructions (Signed)
SELF MOBILIZATION - INFERIOR GLIDE - CHAIR: 4 x 30 seconds   While seated, grasp the side of the seat with the affected arm and slowly lean the opposite direction until a gentle stretch is felt at the shoulder.   Your affected shoulder should be fully relaxed.     SELF MOBILIZATION - INFERIOR GLIDE - TABLE: 4 x 30 seconds     Rest the elbow of your affected shoulder on a table to the side and use your opposite hand to gently press the upper part of your arm downward.  Your affected arm should be fully relaxed.     SELF MOBILIZATION - ANTERIOR GLIDE - SEATED: 4 x 30 seconds     Rest your the hand of your affected shoulder on a table behind you and use your opposite hand to gently pull the upper part of your arm forward.

## 2017-12-13 ENCOUNTER — Ambulatory Visit (HOSPITAL_COMMUNITY): Payer: Worker's Compensation | Admitting: Physical Therapy

## 2017-12-13 DIAGNOSIS — M25611 Stiffness of right shoulder, not elsewhere classified: Secondary | ICD-10-CM | POA: Diagnosis not present

## 2017-12-13 DIAGNOSIS — M25511 Pain in right shoulder: Secondary | ICD-10-CM

## 2017-12-13 NOTE — Therapy (Signed)
[facility] [address] [place], [place], [address] Phone: [phone]   Fax:  [phone]  Physical Therapy Treatment  Patient Details  Name: Diane Wolf MRN: [id number] Date of Birth: 1966/04/28 Referring Provider: Sydnee Cabal   Encounter Date: 12/13/2017  PT End of Session - 12/13/17 1750    Visit Number  23    Number of Visits  29    Date for PT Re-Evaluation  12/06/17    Authorization Type  UHC    Authorization Time Period  12/08/17 - 01/19/18    PT Start Time  3361    PT Stop Time  1750    PT Time Calculation (min)  60 min    Activity Tolerance  Patient tolerated treatment well;No increased pain    Behavior During Therapy  [facility] for tasks assessed/performed       No past medical history on file.  Past Surgical History:  Procedure Laterality Date  . CHOLECYSTECTOMY      There were no vitals filed for this visit.  Subjective Assessment - 12/13/17 1702    Subjective  Pt states she 's hurting a little more today but feels it's because of the damp, rainy weather.  STates she has not had to use her icepack in a while.  Admits to not trying the self mobes over the weekend, just her basic exercises she's already been doing.    Currently in Pain?  Yes    Pain Score  2     Pain Location  Arm    Pain Orientation  Right    Pain Descriptors / Indicators  [facility] PT Treatment/Exercise - 12/13/17 0001      Shoulder Exercises: Sidelying   External Rotation  10 reps;AAROM      Shoulder Exercises: Standing   Other Standing Exercises  wall arch x 15    Other Standing Exercises  Ball toss 15 reps with light ball      Shoulder Exercises: Pulleys   Flexion  2 minutes    ABduction  2 minutes      Shoulder Exercises: ROM/Strengthening   UBE (Upper Arm Bike)  3 minutes backward L1      Manual Therapy   Manual Therapy  Joint mobilization;Myofascial release;Passive ROM    Manual  therapy comments  done seperate from all other aspects of treatment     Joint Mobilization  posterior,inferior, and anterior right glenohumeral glides, grade III/IV, 3x 45 seconds each mobilization with oscilations    Myofascial Release  to decrease adhesions in proximal UE    Passive ROM  Rt shoulder ER/IR, flexion and abduction               PT Short Term Goals - 12/08/17 1653      PT SHORT TERM GOAL #1   Title  Pt Rt UE motion to improve to allow pt to easily wash and style her hair.    Time  3    Period  Weeks    Status  Achieved      PT SHORT TERM GOAL #2   Title  PT pain to be no greater than a 4/10 to allow pt to have a full night sleep     Baseline  12/08/17 - maximum pain of 4/10 and reports sleeping through the night, however her husband tells her she  is restless at night and moves arm a lot    Time  3    Period  Weeks    Status  Achieved      PT SHORT TERM GOAL #3   Title  Pt to be able to reach up to her seatbelt and buckle it without difficulty     Time  3    Period  Weeks    Status  Achieved      PT SHORT TERM GOAL #4   Title  Pt to be completing HEP in order to increase the motion of her right arm to allow the above to occur.    Status  Achieved        PT Long Term Goals - 12/08/17 1657      PT LONG TERM GOAL #1   Title  Pt Rt shoulder  ROM to have improved to allow pt to reach into her back pocket without difficulty     Baseline  11/29/2017:  Able to touch butt, continues to be unable to reach into back pocket; 12/08/17 - able to reach into back pocket with no pain    Status  Achieved      PT LONG TERM GOAL #2   Title  Pt Rt shoulder  ROM to have improved to allow pt to clasp her bra without difficulty. Patient will have equal height of BUE combined shuolder movement for extension/adduction/internal rotation.    Baseline  11/29/2017.  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    Time  6    Status  New    Target Date  01/19/18            Plan - 12/13/17 1751    Clinical Impression Statement  continued with focus on improving ROM with A/AAROM PROM and manual to Rt shoulder.  PT able to complete therex in correct form with  encouragement to push her ROM further.  REviewed self mobes and patient wtihotu questions or concerns.  Encouraged to complete these for optimal results along with her established HEP.  Continued to work on Estate agent, using light volleyball.      Rehab Potential  Good    PT Frequency  1x / week    PT Duration  6 weeks    PT Treatment/Interventions  ADLs/Self Care Home Management;Cryotherapy;Ultrasound;Iontophoresis 18m/ml Dexamethasone;Therapeutic activities;Therapeutic exercise;Patient/family education;Manual techniques;Passive range of motion;Neuromuscular re-education    PT Next Visit Plan  continue joint mobilizations with focus on anterior, inferior glenohumeral glide. Continue myofascial techiques and PROM to increase ROM of Rt shoulder.  Progress AROM exercises and stretches. Progress overhead throwing with  RUE    PT Home Exercise Plan  eval: wand flexion, abduction, IR/ER.  Self joint capsule stretch for posterior, inferior and anterior capsule. 11/15/17 - reverse wall pushup for anterior capsule stretch; 12/08/17 - glenohumeral self mobilization inferior glide and anterior glide    Consulted and Agree with Plan of Care  Patient       Patient will benefit from skilled therapeutic intervention in order to improve the following deficits and impairments:  Decreased activity tolerance, Decreased range of motion, Decreased strength, Impaired flexibility, Impaired UE functional use, Pain, Hypomobility  Visit Diagnosis: Stiffness of right shoulder, not elsewhere classified  Acute pain of right shoulder     Problem List There are no active problems to display for this patient.  Diane Wolf PTA/CLT [phone] FTeena Irani2/10/2018, 5:54 PM  [place]SFlan[place] [address]Phone: [phone]  Fax:  [phone] Name: Diane SHANDSMRN: [id number] of Birth: 1Aug 02, 1967

## 2017-12-15 ENCOUNTER — Encounter (HOSPITAL_COMMUNITY): Payer: 59 | Admitting: Physical Therapy

## 2017-12-16 ENCOUNTER — Encounter (HOSPITAL_COMMUNITY): Payer: 59

## 2017-12-20 ENCOUNTER — Ambulatory Visit (HOSPITAL_COMMUNITY): Payer: Worker's Compensation

## 2017-12-20 ENCOUNTER — Encounter (HOSPITAL_COMMUNITY): Payer: Self-pay

## 2017-12-20 DIAGNOSIS — M25611 Stiffness of right shoulder, not elsewhere classified: Secondary | ICD-10-CM | POA: Diagnosis not present

## 2017-12-20 DIAGNOSIS — M25511 Pain in right shoulder: Secondary | ICD-10-CM

## 2017-12-20 NOTE — Therapy (Signed)
[facility] [address] [place], [place], [address] Phone: [phone]   Fax:  [phone]  Physical Therapy Treatment  Patient Details  Name: Diane Wolf MRN: [id number] Date of Birth: 1966-08-20 Referring Provider: Sydnee Cabal   Encounter Date: 12/20/2017  PT End of Session - 12/20/17 1527    Visit Number  24    Number of Visits  29    Authorization Type  UHC    Authorization Time Period  12/08/17 - 01/19/18    PT Start Time  1523 3' on bike, no charge    PT Stop Time  1605    PT Time Calculation (min)  42 min    Activity Tolerance  Patient tolerated treatment well;No increased pain    Behavior During Therapy  [facility] for tasks assessed/performed       History reviewed. No pertinent past medical history.  Past Surgical History:  Procedure Laterality Date  . CHOLECYSTECTOMY      There were no vitals filed for this visit.  Subjective Assessment - 12/20/17 1526    Subjective  Pt stated she had day off work yesterday for President's day, stated her shoulder is feeling good today.  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    Patient Stated Goals  no pain with activity     Currently in Pain?  No/denies                      [facility] Adult PT Treatment/Exercise - 12/20/17 0001      Exercises   Exercises  Shoulder      Shoulder Exercises: Standing   External Rotation  Strengthening;15 reps    Internal Rotation  15 reps    Other Standing Exercises  wall arch x 15    Other Standing Exercises  Ball toss 20 reps with light ball; D2 2x 15; IR: scratching back 5x       Shoulder Exercises: ROM/Strengthening   UBE (Upper Arm Bike)  3 minutes backward L1      Manual Therapy   Manual Therapy  Joint mobilization;Myofascial release;Passive ROM    Manual therapy comments  done seperate from all other aspects of treatment     Joint Mobilization  posterior,inferior, and anterior right glenohumeral glides,  grade III/IV, 3x 45 seconds each mobilization with oscilations    Myofascial Release  to decrease adhesions in proximal UE    Passive ROM  Rt shoulder ER/IR, flexion and abduction               PT Short Term Goals - 12/08/17 1653      PT SHORT TERM GOAL #1   Title  Pt Rt UE motion to improve to allow pt to easily wash and style her hair.    Time  3    Period  Weeks    Status  Achieved      PT SHORT TERM GOAL #2   Title  PT pain to be no greater than a 4/10 to allow pt to have a full night sleep     Baseline  12/08/17 - maximum pain of 4/10 and reports sleeping through the night, however her husband tells her she is restless at night and moves arm a lot    Time  3    Period  Weeks    Status  Achieved      PT SHORT TERM GOAL #3   Title  Pt to be able  to reach up to her seatbelt and buckle it without difficulty     Time  3    Period  Weeks    Status  Achieved      PT SHORT TERM GOAL #4   Title  Pt to be completing HEP in order to increase the motion of her right arm to allow the above to occur.    Status  Achieved        PT Long Term Goals - 12/08/17 1657      PT LONG TERM GOAL #1   Title  Pt Rt shoulder  ROM to have improved to allow pt to reach into her back pocket without difficulty     Baseline  11/29/2017:  Able to touch butt, continues to be unable to reach into back pocket; 12/08/17 - able to reach into back pocket with no pain    Status  Achieved      PT LONG TERM GOAL #2   Title  Pt Rt shoulder  ROM to have improved to allow pt to clasp her bra without difficulty. Patient will have equal height of BUE combined shuolder movement for extension/adduction/internal rotation.    Baseline  11/29/2017.  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    Time  6    Status  New    Target Date  01/19/18            Plan - 12/20/17 1600    Clinical Impression Statement  Continued session foucs wiht shoulder mobility and functional strengthening.  Pt able to tolerated manual PROM/AAROM with no reports of pain except for end range ER, pt verbalized improved tolerance with manual this session.  Added D2 movements to improve shoulder mobility.  Improved mechanics noted with ball tossing today.      Rehab Potential  Good    PT Frequency  1x / week    PT Duration  6 weeks    PT Treatment/Interventions  ADLs/Self Care Home Management;Cryotherapy;Ultrasound;Iontophoresis 70m/ml Dexamethasone;Therapeutic activities;Therapeutic exercise;Patient/family education;Manual techniques;Passive range of motion;Neuromuscular re-education    PT Next Visit Plan  continue joint mobilizations with focus on anterior, inferior glenohumeral glide. Continue myofascial techiques and PROM to increase ROM of Rt shoulder.  Progress AROM exercises and stretches. Progress overhead throwing with [place]  Exercise Plan  eval: wand flexion, abduction, IR/ER.  Self joint capsule stretch for posterior, inferior and anterior capsule. 11/15/17 - reverse wall pushup for anterior capsule stretch;  12/08/17 - glenohumeral self mobilization inferior glide and anterior glide       Patient will benefit from skilled therapeutic intervention in order to improve the following deficits and impairments:  Decreased activity tolerance, Decreased range of motion, Decreased strength, Impaired flexibility, Impaired UE functional use, Pain, Hypomobility  Visit Diagnosis: Stiffness of right shoulder, not elsewhere classified  Acute pain of right shoulder     Problem List There are no active problems to display for this patient.  [address] LPTA; [place]  Aldona Lento 12/20/2017, 4:04 PM  [facility] [place], [place], [address] Phone: [phone]   Fax:  [phone]  Name: Diane Wolf MRN: [id number] Date of Birth: [birth year]

## 2017-12-28 ENCOUNTER — Ambulatory Visit (HOSPITAL_COMMUNITY): Payer: Worker's Compensation

## 2017-12-28 ENCOUNTER — Encounter (HOSPITAL_COMMUNITY): Payer: Self-pay

## 2017-12-28 DIAGNOSIS — M25511 Pain in right shoulder: Secondary | ICD-10-CM

## 2017-12-28 DIAGNOSIS — M25611 Stiffness of right shoulder, not elsewhere classified: Secondary | ICD-10-CM

## 2017-12-28 NOTE — Therapy (Signed)
[facility] [address] [place], [place], [address] Phone: [phone]   Fax:  [phone]  Physical Therapy Treatment  Patient Details  Name: Diane Wolf MRN: [id number] Date of Birth: [birth year] Referring Provider: Sydnee Cabal   Encounter Date: 12/28/2017  PT End of Session - 12/28/17 1654    Visit Number  25    Number of Visits  29    Date for PT Re-Evaluation  01/05/18    Authorization Type  UHC    Authorization Time Period  12/08/17 - 01/19/18    PT Start Time  1650 3' on bike, no charge    PT Stop Time  1731    PT Time Calculation (min)  41 min    Activity Tolerance  Patient tolerated treatment well;No increased pain    Behavior During Therapy  [facility] for tasks assessed/performed       History reviewed. No pertinent past medical history.  Past Surgical History:  Procedure Laterality Date  . CHOLECYSTECTOMY      There were no vitals filed for this visit.  Subjective Assessment - 12/28/17 1653    Subjective  Pt stated she is feeling good today, no reports of pain.  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      Patient Stated Goals  no pain with activity     Currently in Pain?  No/denies                      [facility] Adult PT Treatment/Exercise - 12/28/17 0001      Shoulder Exercises: Standing   Other Standing Exercises  wall arch x 15    Other Standing Exercises  Ball toss 20 reps with light ball; D2 2x 15; IR: scratching back 5x; jumping jacks 15x      Shoulder Exercises: ROM/Strengthening   UBE (Upper Arm Bike)  3 minutes backward L1      Shoulder Exercises: Stretch   Corner Stretch  3 reps;30 seconds      Manual Therapy   Manual Therapy  Joint mobilization;Myofascial release;Passive ROM;Soft tissue mobilization    Manual therapy comments  done seperate from all other aspects of treatment     Joint Mobilization  posterior,inferior, and anterior right glenohumeral glides,  grade III/IV, 3x 45 seconds each mobilization with oscilations    Soft tissue mobilization  Rt pec minor (bird's nest x 51mn)    Myofascial Release  to decrease adhesions in proximal UE    Passive ROM  Rt shoulder ER/IR, flexion and abduction               PT Short Term Goals - 12/08/17 1653      PT SHORT TERM GOAL #1   Title  Pt Rt UE motion to improve to allow pt to easily wash and style her hair.    Time  3    Period  Weeks    Status  Achieved      PT SHORT TERM GOAL #2   Title  PT pain to be no greater than a 4/10 to allow pt to have a full night sleep     Baseline  12/08/17 - maximum pain of 4/10 and reports sleeping through the night, however her husband tells her she is restless at night and moves arm a lot    Time  3    Period  Weeks    Status  Achieved  PT SHORT TERM GOAL #3   Title  Pt to be able to reach up to her seatbelt and buckle it without difficulty     Time  3    Period  Weeks    Status  Achieved      PT SHORT TERM GOAL #4   Title  Pt to be completing HEP in order to increase the motion of her right arm to allow the above to occur.    Status  Achieved        PT Long Term Goals - 12/08/17 1657      PT LONG TERM GOAL #1   Title  Pt Rt shoulder  ROM to have improved to allow pt to reach into her back pocket without difficulty     Baseline  11/29/2017:  Able to touch butt, continues to be unable to reach into back pocket; 12/08/17 - able to reach into back pocket with no pain    Status  Achieved      PT LONG TERM GOAL #2   Title  Pt Rt shoulder  ROM to have improved to allow pt to clasp her bra without difficulty. Patient will have equal height of BUE combined shuolder movement for extension/adduction/internal rotation.    Baseline  11/29/2017.  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    Time  6    Status  New    Target Date  01/19/18            Plan - 12/28/17 1730    Clinical Impression Statement  Continued session focus with shoulder mobility and functional strengthening.  Added jumping jacks to improve functional movements and ROM.  Continues manual PROM with joint mobs with STM to pec minor and UE to reduce tightness.  No reports of pain through session.      Rehab Potential  Good    PT Frequency  1x / week    PT Duration  6 weeks    PT Treatment/Interventions  ADLs/Self Care Home Management;Cryotherapy;Ultrasound;Iontophoresis 34m/ml Dexamethasone;Therapeutic activities;Therapeutic exercise;Patient/family education;Manual techniques;Passive range of motion;Neuromuscular re-education    PT Next Visit Plan  continue joint mobilizations with focus on anterior, inferior glenohumeral glide. Continue myofascial techiques and PROM to increase ROM of Rt shoulder.  Progress AROM exercises and stretches. Progress overhead throwing with  RUE    PT Home Exercise Plan  eval: wand flexion, abduction, IR/ER.  Self joint capsule stretch for posterior, inferior and anterior capsule. 11/15/17 - reverse wall pushup for anterior capsule  stretch; 12/08/17 - glenohumeral self mobilization inferior glide and anterior glide       Patient will benefit from skilled therapeutic intervention in order to improve the following deficits and impairments:  Decreased activity tolerance, Decreased range of motion, Decreased strength, Impaired flexibility, Impaired UE functional use, Pain, Hypomobility  Visit Diagnosis: Stiffness of right shoulder, not elsewhere classified  Acute pain of right shoulder     Problem List There are no active problems to display for this patient.  [address] LPTA; [place]  Aldona Lento 12/28/2017, 6:22 PM  [facility] [address] [place], [place], [address] Phone: [phone]   Fax:  [phone]  Name: JAIDON SPONSEL MRN: [id number] Date of Birth: [birth year]

## 2018-01-03 ENCOUNTER — Ambulatory Visit (HOSPITAL_COMMUNITY): Payer: Worker's Compensation | Attending: Specialist | Admitting: Physical Therapy

## 2018-01-03 DIAGNOSIS — M25611 Stiffness of right shoulder, not elsewhere classified: Secondary | ICD-10-CM | POA: Diagnosis not present

## 2018-01-03 DIAGNOSIS — M25511 Pain in right shoulder: Secondary | ICD-10-CM | POA: Diagnosis present

## 2018-01-03 NOTE — Therapy (Signed)
[facility] [address] [place], [place], [address] Phone: [phone]   Fax:  [phone]  Physical Therapy Treatment  Patient Details  Name: Diane Wolf MRN: [id number] Date of Birth: 1965/12/22 Referring Provider: Sydnee Cabal   Encounter Date: 01/03/2018  PT End of Session - 01/03/18 1656    Visit Number  26    Number of Visits  29    Date for PT Re-Evaluation  01/05/18    Authorization Type  UHC    Authorization Time Period  12/08/17 - 01/19/18    PT Start Time  6599    PT Stop Time  1730    PT Time Calculation (min)  40 min    Activity Tolerance  Patient tolerated treatment well;No increased pain    Behavior During Therapy  [facility] for tasks assessed/performed       No past medical history on file.  Past Surgical History:  Procedure Laterality Date  . CHOLECYSTECTOMY      There were no vitals filed for this visit.  Subjective Assessment - 01/03/18 1756    Subjective  Pt stated she is feeling good today, no reports of pain.  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  Sometimes has pain still but much better than it was.       Currently in Pain?  No/denies         [facility] PT Assessment - 01/03/18 0001      Assessment   Medical Diagnosis  Rt shoulder impingement    Referring Provider  Sydnee Cabal    Onset Date/Surgical Date  01/13/17    Next MD Visit  week of 3/11      Observation/Other Assessments   Focus on Therapeutic Outcomes (FOTO)   25% limited was 37% limited 1/15 and 34% limited 2/7      AROM   Overall AROM Comments  supine     Right Shoulder Flexion  165 Degrees was 120 degrees on 09/06/17 and  165 degrees 12/08/17    Right Shoulder ABduction  145 Degrees was 60 degrees 09/06/17 and 168 on 12/08/16    Right Shoulder Internal Rotation  70 Degrees was 22 degrees 09/06/17 and 70 degrees 12/08/17    Right Shoulder External Rotation  65 Degrees was 18 degrees 09/06/17 and 65  degrees 12/08/17      Strength   Right Shoulder Flexion  5/5    Right Shoulder Extension  5/5    Right Shoulder ABduction  5/5    Right Shoulder Internal Rotation  5/5    Right Shoulder External Rotation  4+/5    Right Shoulder Horizontal ABduction  5/5    Right Shoulder Horizontal ADduction  5/5                  [facility] Adult PT Treatment/Exercise - 01/03/18 0001      Shoulder Exercises: Pulleys   Flexion  2 minutes    ABduction  2 minutes      Shoulder Exercises: ROM/Strengthening   UBE (Upper Arm Bike)  4 minutes backward L1      Manual Therapy   Manual Therapy  Joint mobilization;Myofascial release;Passive ROM;Soft tissue mobilization    Manual therapy comments  done seperate from all other aspects of treatment     Joint Mobilization  posterior,inferior, and anterior right glenohumeral glides, grade III/IV, 3x 45 seconds each mobilization with oscilations    Soft tissue mobilization  Rt pec minor (  bird's nest x 44mn)    Myofascial Release  to decrease adhesions in proximal UE    Passive ROM  Rt shoulder ER/IR, flexion and abduction               PT Short Term Goals - 01/03/18 1658      PT SHORT TERM GOAL #1   Title  Pt Rt UE motion to improve to allow pt to easily wash and style her hair.    Time  3    Period  Weeks    Status  Achieved      PT SHORT TERM GOAL #2   Title  PT pain to be no greater than a 4/10 to allow pt to have a full night sleep     Baseline  3/5:  wakes 0-1X night due to repositioning for comfort with max of 3/10 pain.    Time  3    Period  Weeks    Status  Partially Met      PT SHORT TERM GOAL #3   Title  Pt to be able to reach up to her seatbelt and buckle it without difficulty     Time  3    Period  Weeks    Status  Achieved      PT SHORT TERM GOAL #4   Title  Pt to be completing HEP in order to increase the motion of her right arm to allow the above to occur.    Status  Achieved        PT Long Term Goals - 01/03/18  1700      PT LONG TERM GOAL #1   Title  Pt Rt shoulder  ROM to have improved to allow pt to reach into her back pocket without difficulty     Baseline  11/29/2017:  Able to touch butt, continues to be unable to reach into back pocket; 12/08/17 - able to reach into back pocket with no pain    Status  Achieved      PT LONG TERM GOAL #2   Title  Pt Rt shoulder ROM to have improved to allow patient to comfortably reach to back pocket/waist area.    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    Time  6    Status  Achieved            Plan - 01/03/18 1747    Clinical Impression  Statement  ROM measurements completed following warm-up and manual with no change noted from last months measurements and little improvement X past 3 months.  ROM has improved significantly from intial evaluation in Nov 2018 with 45 degree gain in flexion,, 85 degree gain in abduction, 48 degree gain in IR and 47 degree in ER.   Pt has met all goals with exception of waking at night (due to discomfort but much improved pain) and goal to reach behind her back was modified to pocket without pain.  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      Rehab Potential  Good    PT Frequency  1x / week    PT Duration  6 weeks    PT Treatment/Interventions  ADLs/Self Care Home Management;Cryotherapy;Ultrasound;Iontophoresis 11m/ml Dexamethasone;Therapeutic activities;Therapeutic exercise;Patient/family education;Manual techniques;Passive range of motion;Neuromuscular re-education    PT Next Visit Plan  Returns to MD next week, await further orders regarding continuation vs discharge.      PT Home Exercise Plan  eval: wand flexion, abduction, IR/ER.  Self joint capsule stretch for posterior, inferior and anterior capsule. 11/15/17 - reverse wall pushup for anterior capsule stretch; 12/08/17 - glenohumeral self mobilization inferior glide and anterior glide       Patient will benefit from skilled therapeutic intervention in order to improve the following deficits and impairments:  Decreased activity tolerance, Decreased range of motion, Decreased strength, Impaired flexibility, Impaired UE functional use, Pain, Hypomobility  Visit Diagnosis: Stiffness of right shoulder, not elsewhere classified  Acute pain of right shoulder     Problem List There are no active problems to display for this patient.  AZEKIAH COEN PTA/CLT [phone] FTeena Irani3/03/2018, 5:59 PM  [place] [address]Phone: [phone]  Fax:  [phone] Name: ACHIKITA DOGANMRN: [id number] of Birth: 122-Nov-1967

## 2018-01-10 ENCOUNTER — Encounter (HOSPITAL_COMMUNITY): Payer: Self-pay

## 2018-01-11 ENCOUNTER — Ambulatory Visit (HOSPITAL_COMMUNITY): Payer: Worker's Compensation

## 2018-01-11 ENCOUNTER — Telehealth (HOSPITAL_COMMUNITY): Payer: Self-pay

## 2018-01-11 NOTE — Telephone Encounter (Signed)
Something came up and she can not be here, she is so sorry

## 2018-01-17 ENCOUNTER — Telehealth (HOSPITAL_COMMUNITY): Payer: Self-pay | Admitting: Physical Therapy

## 2018-01-17 ENCOUNTER — Ambulatory Visit (HOSPITAL_COMMUNITY): Payer: Worker's Compensation | Admitting: Physical Therapy

## 2018-01-17 NOTE — Telephone Encounter (Signed)
Spoke to Worker'sComp Nurse this morning and she will recover information. Nurse states pt did not follow proper protocol and that why everything is a mess. She requsted time to review and will contact me back.  NF 01/17/17

## 2018-01-17 NOTE — Telephone Encounter (Signed)
Pt called stating Dr. Thomasena Edisollins cx her f/u and r/s for 3/26-she will call to r/s after that visit. Pt states she came before approval of Worker Comp b/c she had to get better and would work out the [employer] later. She states its just been a mess dealing workcomp. NF 01/17/18

## 2018-01-17 NOTE — Telephone Encounter (Signed)
Pt called stating Dr. Thomasena Edisollins cx her f/u and r/s for 3/26-she will call to r/s after that visit. Pt states she came before approval of Worker Comp b/c she had to get better and would work out the [employer] later. She states its just been a mess dealing workcomp. NF 01/17/18 Spoke with Worker's Comp Nurse [place] MarcusSherry Barg BSN- She states she will need time for recovery and will contact Dr. Thomasena Edisollins office for notes, she also requested our progress note. Spoke with Shannara Keizer to sign Med-Release. Patient will be here at 1:45 pm to sign Medical Release and then progress notes will be faxed to worker's comp nurse.  NF 01/17/17

## 2018-01-27 ENCOUNTER — Telehealth (HOSPITAL_COMMUNITY): Payer: Self-pay | Admitting: Physical Therapy

## 2018-01-27 NOTE — Telephone Encounter (Signed)
Pt saw MD and requested to be D/c today 01/27/2018

## 2018-03-02 ENCOUNTER — Encounter (HOSPITAL_COMMUNITY): Payer: Self-pay | Admitting: Physical Therapy

## 2018-03-02 NOTE — Therapy (Signed)
[facility] [address] [place], [place], [address] Phone: [phone]   Fax:  [phone]  Physical Therapy Treatment  Patient Details  Name: Diane Wolf MRN: [id number] Date of Birth: [birth year] Referring Provider: Sydnee Cabal   Encounter Date: 03/02/2018   PHYSICAL THERAPY DISCHARGE SUMMARY  Visits from Start of Care: 26  Current functional level related to goals / functional outcomes: See last note   Remaining deficits: ROM   Education / Equipment: HEP Plan: Patient agrees to discharge.  Patient goals were partially met. Patient is being discharged due to the physician's request.  ?????       No past medical history on file.  Past Surgical History:  Procedure Laterality Date  . CHOLECYSTECTOMY      There were no vitals filed for this visit.    Rayetta Humphrey, PT CLT [phone]                          PT Short Term Goals - 01/03/18 1658      PT SHORT TERM GOAL #1   Title  Pt Rt UE motion to improve to allow pt to easily wash and style her hair.    Time  3    Period  Weeks    Status  Achieved      PT SHORT TERM GOAL #2   Title  PT pain to be no greater than a 4/10 to allow pt to have a full night sleep     Baseline  3/5:  wakes 0-1X night due to repositioning for comfort with max of 3/10 pain.    Time  3    Period  Weeks    Status  Partially Met      PT SHORT TERM GOAL #3   Title  Pt to be able to reach up to her seatbelt and buckle it without difficulty     Time  3    Period  Weeks    Status  Achieved      PT SHORT TERM GOAL #4   Title  Pt to be completing HEP in order to increase the motion of her right arm to allow the above to occur.    Status  Achieved        PT Long Term Goals - 01/03/18 1700      PT LONG TERM GOAL #1   Title  Pt Rt shoulder  ROM to have improved to allow pt to reach into her back pocket without difficulty     Baseline  11/29/2017:  Able to  touch butt, continues to be unable to reach into back pocket; 12/08/17 - able to reach into back pocket with no pain    Status  Achieved      PT LONG TERM GOAL #2   Title  Pt Rt shoulder ROM to have improved to allow patient to comfortably reach to back pocket/waist area.    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    Time  6    Status  Achieved              Patient will benefit from skilled therapeutic intervention in order to improve the following deficits and impairments:     Visit Diagnosis: No diagnosis found.     Problem List There are no active problems to display for this patient.   RUSSELL,CINDY 03/02/2018, 2:10 PM  [facility] [place], [place], [address] Phone: [phone]   Fax:  [phone]  Name: Diane Wolf MRN: [id number] Date of Birth: [birth year]

## 2018-05-24 ENCOUNTER — Other Ambulatory Visit: Payer: Self-pay | Admitting: Obstetrics and Gynecology

## 2018-05-24 DIAGNOSIS — Z1231 Encounter for screening mammogram for malignant neoplasm of breast: Secondary | ICD-10-CM

## 2018-06-19 ENCOUNTER — Ambulatory Visit: Payer: Self-pay

## 2018-07-17 ENCOUNTER — Ambulatory Visit
Admission: RE | Admit: 2018-07-17 | Discharge: 2018-07-17 | Disposition: A | Discharge status: Home / Self Care | Payer: 59 | Source: Ambulatory Visit | Attending: Obstetrics and Gynecology | Admitting: Obstetrics and Gynecology

## 2018-07-17 DIAGNOSIS — Z1231 Encounter for screening mammogram for malignant neoplasm of breast: Secondary | ICD-10-CM

## 2019-08-21 ENCOUNTER — Other Ambulatory Visit: Payer: Self-pay

## 2019-08-21 DIAGNOSIS — Z20822 Contact with and (suspected) exposure to covid-19: Secondary | ICD-10-CM

## 2019-08-23 LAB — NOVEL CORONAVIRUS, NAA: SARS-CoV-2, NAA: NOT DETECTED

## 2019-08-28 ENCOUNTER — Other Ambulatory Visit: Payer: Self-pay | Admitting: Obstetrics and Gynecology

## 2019-08-28 DIAGNOSIS — Z1231 Encounter for screening mammogram for malignant neoplasm of breast: Secondary | ICD-10-CM

## 2019-10-15 ENCOUNTER — Ambulatory Visit
Admission: RE | Admit: 2019-10-15 | Discharge: 2019-10-15 | Disposition: A | Discharge status: Home / Self Care | Payer: 59 | Source: Ambulatory Visit | Attending: Obstetrics and Gynecology | Admitting: Obstetrics and Gynecology

## 2019-10-15 ENCOUNTER — Other Ambulatory Visit: Payer: Self-pay

## 2019-10-15 DIAGNOSIS — Z1231 Encounter for screening mammogram for malignant neoplasm of breast: Secondary | ICD-10-CM

## 2020-06-29 ENCOUNTER — Encounter: Payer: Self-pay | Admitting: Internal Medicine

## 2020-08-25 NOTE — Progress Notes (Signed)
Primary Care Physician:  Juliette Alcide, MD Primary Gastroenterologist:  Dr. Jena Gauss  Chief Complaint  Patient presents with  . Diarrhea    past several years since gallbladder removed approx 15 yrs ago  . Colonoscopy    never had tcs; grandmother had colon cancer in 1960s    HPI:   Diane Wolf is a 54 y.o. female who presents on referral from primary care to scheduled a colonoscopy. Nurse/phone triage was deferred to office visit due to complaints of diarrhea.  Reviewed information provided with referral including office visit dated 06/18/2020 which is a well woman exam.  Noted chronic diarrhea status post cholecystectomy.  Recommend referral for first ever colonoscopy.  No history of colonoscopy found in our system.  Today she states she is doing okay overall. She has had loose stools for the past 10-15 years since having her gallbladder out. Waxes and wanes in severity. Some food triggers (caffeine). Worse in the morning, worse with greasy or fatty/fried foods. Has some urgency. Denies abdominal pain, N/V, hematochezia, melena, fever, chills, unintentional weight loss.   Grandmother had colon cancer.  Past Medical History:  Diagnosis Date  . Gestational diabetes   . History of cholelithiasis     Past Surgical History:  Procedure Laterality Date  . CHOLECYSTECTOMY      Current Outpatient Medications  Medication Sig Dispense Refill  . Cholecalciferol (VITAMIN D) 50 MCG (2000 UT) tablet Take 2,000 Units by mouth daily.    . Probiotic Product (PROBIOTIC DAILY PO) Take by mouth. Takes occassionally     No current facility-administered medications for this visit.    Allergies as of 08/26/2020 - Review Complete 08/26/2020  Allergen Reaction Noted  . Contrast media [iodinated diagnostic agents]  10/20/2014    Family History  Problem Relation Age of Onset  . Colon cancer Paternal Grandmother   . Breast cancer Neg Hx     Social History   Socioeconomic History  .  Marital status: Married    Spouse name: Not on file  . Number of children: Not on file  . Years of education: Not on file  . Highest education level: Not on file  Occupational History  . Not on file  Tobacco Use  . Smoking status: Never Smoker  . Smokeless tobacco: Never Used  Substance and Sexual Activity  . Alcohol use: Yes    Comment: 1-2 drinks on vacation  . Drug use: No  . Sexual activity: Not on file  Other Topics Concern  . Not on file  Social History Narrative  . Not on file   Social Determinants of Health   Financial Resource Strain:   . Difficulty of Paying Living Expenses: Not on file  Food Insecurity:   . Worried About Programme researcher, broadcasting/film/video in the Last Year: Not on file  . Ran Out of Food in the Last Year: Not on file  Transportation Needs:   . Lack of Transportation (Medical): Not on file  . Lack of Transportation (Non-Medical): Not on file  Physical Activity:   . Days of Exercise per Week: Not on file  . Minutes of Exercise per Session: Not on file  Stress:   . Feeling of Stress : Not on file  Social Connections:   . Frequency of Communication with Friends and Family: Not on file  . Frequency of Social Gatherings with Friends and Family: Not on file  . Attends Religious Services: Not on file  . Active Member of Clubs  or Organizations: Not on file  . Attends Banker Meetings: Not on file  . Marital Status: Not on file  Intimate Partner Violence:   . Fear of Current or Ex-Partner: Not on file  . Emotionally Abused: Not on file  . Physically Abused: Not on file  . Sexually Abused: Not on file    Subjective: Review of Systems  Constitutional: Negative for chills, fever, malaise/fatigue and weight loss.  HENT: Negative for congestion and sore throat.   Respiratory: Negative for cough and shortness of breath.   Cardiovascular: Negative for chest pain and palpitations.  Gastrointestinal: Positive for diarrhea. Negative for abdominal pain,  blood in stool, melena, nausea and vomiting.  Musculoskeletal: Negative for joint pain and myalgias.  Skin: Negative for rash.  Neurological: Negative for dizziness and weakness.  Endo/Heme/Allergies: Does not bruise/bleed easily.  Psychiatric/Behavioral: Negative for depression. The patient is not nervous/anxious.   All other systems reviewed and are negative.      Objective: BP 131/76   Pulse 69   Temp (!) 96.9 F (36.1 C) (Temporal)   Ht 5\' 5"  (1.651 m)   Wt 161 lb 9.6 oz (73.3 kg)   LMP 01/11/2011   BMI 26.89 kg/m  Physical Exam Vitals and nursing note reviewed.  Constitutional:      General: She is not in acute distress.    Appearance: Normal appearance. She is well-developed and normal weight. She is not ill-appearing, toxic-appearing or diaphoretic.  HENT:     Head: Normocephalic and atraumatic.     Nose: No congestion or rhinorrhea.  Eyes:     General: No scleral icterus. Cardiovascular:     Rate and Rhythm: Normal rate and regular rhythm.     Heart sounds: Normal heart sounds.  Pulmonary:     Effort: Pulmonary effort is normal. No respiratory distress.     Breath sounds: Normal breath sounds.  Abdominal:     General: Bowel sounds are normal.     Palpations: Abdomen is soft. There is no hepatomegaly, splenomegaly or mass.     Tenderness: There is no abdominal tenderness. There is no guarding or rebound.     Hernia: No hernia is present.  Skin:    General: Skin is warm and dry.     Coloration: Skin is not jaundiced.     Findings: No rash.  Neurological:     General: No focal deficit present.     Mental Status: She is alert and oriented to person, place, and time.  Psychiatric:        Attention and Perception: Attention normal.        Mood and Affect: Mood normal.        Speech: Speech normal.        Behavior: Behavior normal.        Thought Content: Thought content normal.        Cognition and Memory: Cognition and memory normal.      Assessment:    Pleasant 54 year old female with a family history of colon cancer in her grandmother, no primary relatives with colon cancer.  She presents to schedule first ever colonoscopy.  Also complains of chronic diarrhea for the past 15 years since cholecystectomy.  Diarrhea: 15-year history since cholecystectomy, typically worse with fried or greasy foods, caffeine.  Typically only occurs first thing in the morning, after eating breakfast.  Based on her symptoms and presentation likely bile salt diarrhea.  Doubt overt infection given the length of time she had her  symptoms.  Denies any abdominal pain which essentially rules out irritable bowel syndrome.  At this point I will trial her on cholestyramine up to 3 times a day.  I recommended she start taking it first in the morning when she wakes up, prior to eating.  I have discussed the need to space other medications at least 1-1/2 hours before or 4 hours after cholestyramine.  Need for colonoscopy: No red flag or warning signs or symptoms.  Colonoscopy for colon cancer screening.  Secondary relative with history as noted above.  Diarrhea not likely consequential given the timeframe and paucity of symptoms.    Proceed with colonoscopy by Dr. Jena Gauss in near future: the risks, benefits, and alternatives have been discussed with the patient in detail. The patient states understanding and desires to proceed.  The patient is not on any anticoagulants, anxiolytics, chronic pain medications, antidepressants, antidiabetics, or iron supplements.   Plan: 1. Cholestyramine up to 3 times a day, primarily pursing in the morning 2. Colonoscopy on conscious sedation 3. Follow-up in 2 months    Thank you for allowing [place] to participate in the care of Sally-Anne S Slotnick  Wynne Dust, DNP, AGNP-C Adult & Gerontological Nurse Practitioner [facility]   08/26/2020 8:20 AM   Disclaimer: This note was dictated with voice recognition software.  Similar sounding words can inadvertently be transcribed and may not be corrected upon review.

## 2020-08-26 ENCOUNTER — Ambulatory Visit (INDEPENDENT_AMBULATORY_CARE_PROVIDER_SITE_OTHER): Payer: 59 | Admitting: Nurse Practitioner

## 2020-08-26 ENCOUNTER — Other Ambulatory Visit: Payer: Self-pay

## 2020-08-26 ENCOUNTER — Telehealth: Payer: Self-pay

## 2020-08-26 ENCOUNTER — Encounter: Payer: Self-pay | Admitting: Nurse Practitioner

## 2020-08-26 DIAGNOSIS — Z0000 Encounter for general adult medical examination without abnormal findings: Secondary | ICD-10-CM

## 2020-08-26 DIAGNOSIS — R197 Diarrhea, unspecified: Secondary | ICD-10-CM

## 2020-08-26 MED ORDER — CHOLESTYRAMINE 4 G PO PACK: 4.0000 g | PACK | Freq: Three times a day (TID) | ORAL | 11 refills | Status: AC

## 2020-08-26 NOTE — Patient Instructions (Signed)
Your health issues we discussed today were:   Diarrhea: 1. As we discussed, I have sent in cholestyramine (Questran) packets.  Take first thing in the morning.  You can take this up to 3 times a day 2. Try to avoid trigger foods such as greasy foods or fried foods 3. Call us for any worsening or severe symptoms 4. As we discussed, you should space out your other medications and supplements minimum 1-1/2 hours before cholestyramine or 4 hours after cholestyramine 5. Call us if you have any worsening problems or symptoms  Need for colonoscopy: 1. We will schedule colonoscopy for you 2. Further recommendations will follow  Overall I recommend:  1. Continue your other current medications 2. Return for follow-up in 2 months to evaluate your diarrhea 3. Call us for any questions or concerns   At [facility] we value your feedback. You may receive a survey about your visit today. Please share your experience as we strive to create trusting relationships with our patients to provide genuine, compassionate, quality care.  We appreciate your understanding and patience as we review any laboratory studies, imaging, and other diagnostic tests that are ordered as we care for you. Our office policy is 5 business days for review of these results, and any emergent or urgent results are addressed in a timely manner for your best interest. If you do not hear from our office in 1 week, please contact us.   We also encourage the use of MyChart, which contains your medical information for your review as well. If you are not enrolled in this feature, an access code is on this after visit summary for your convenience. Thank you for allowing [place] to be involved in your care.  It was great to see you today!  I hope you have a great Fall!!

## 2020-08-26 NOTE — Telephone Encounter (Signed)
Pt was scheduled for TCS during OV for 10/03/20. She called office to reschedule d/t conflict with husband. TCS w/RMR rescheduled to 09/16/20 at 10:00am. Covid test 09/15/20. New instructions and appt letter mailed.

## 2020-08-26 NOTE — Progress Notes (Signed)
CC'ED TO PCP 

## 2020-08-26 NOTE — Telephone Encounter (Signed)
No PA needed for TCS per [employer].

## 2020-09-10 ENCOUNTER — Encounter: Payer: Self-pay | Admitting: *Deleted

## 2020-09-10 ENCOUNTER — Telehealth: Payer: Self-pay | Admitting: *Deleted

## 2020-09-10 NOTE — Telephone Encounter (Signed)
Called pt and informed her that her procedure on 09/16/2020 needed to be rescheduled due to a conflict in procedure scheduling at the hospital.  Pt rescheduled her Covid screening to 10/08/2020 at 11:00.  She rescheduled her procedure to 10/10/2020 at 7:30 with arrival at 6:30.  Pt made aware that I am mailing out new prep instructions.  Called and left vm for [place] in [facility].

## 2020-09-15 ENCOUNTER — Other Ambulatory Visit (HOSPITAL_COMMUNITY): Payer: No Typology Code available for payment source

## 2020-10-01 ENCOUNTER — Other Ambulatory Visit (HOSPITAL_COMMUNITY): Payer: No Typology Code available for payment source

## 2020-10-08 ENCOUNTER — Other Ambulatory Visit: Payer: Self-pay

## 2020-10-08 ENCOUNTER — Other Ambulatory Visit (HOSPITAL_COMMUNITY)
Admission: RE | Admit: 2020-10-08 | Discharge: 2020-10-08 | Disposition: A | Discharge status: Home / Self Care | Payer: 59 | Source: Ambulatory Visit | Attending: Internal Medicine | Admitting: Internal Medicine

## 2020-10-08 DIAGNOSIS — Z01812 Encounter for preprocedural laboratory examination: Secondary | ICD-10-CM | POA: Insufficient documentation

## 2020-10-08 DIAGNOSIS — Z20822 Contact with and (suspected) exposure to covid-19: Secondary | ICD-10-CM | POA: Diagnosis not present

## 2020-10-08 LAB — SARS CORONAVIRUS 2 (TAT 6-24 HRS): SARS Coronavirus 2: NEGATIVE

## 2020-10-10 ENCOUNTER — Encounter (HOSPITAL_COMMUNITY)
Admission: RE | Disposition: A | Discharge status: Home / Self Care | Payer: Self-pay | Source: Home / Self Care | Attending: Internal Medicine

## 2020-10-10 ENCOUNTER — Ambulatory Visit (HOSPITAL_COMMUNITY)
Admission: RE | Admit: 2020-10-10 | Discharge: 2020-10-10 | Disposition: A | Discharge status: Home / Self Care | Payer: 59 | Attending: Internal Medicine | Admitting: Internal Medicine

## 2020-10-10 ENCOUNTER — Encounter (HOSPITAL_COMMUNITY): Payer: Self-pay | Admitting: Internal Medicine

## 2020-10-10 ENCOUNTER — Other Ambulatory Visit: Payer: Self-pay

## 2020-10-10 DIAGNOSIS — Z79899 Other long term (current) drug therapy: Secondary | ICD-10-CM | POA: Insufficient documentation

## 2020-10-10 DIAGNOSIS — K5730 Diverticulosis of large intestine without perforation or abscess without bleeding: Secondary | ICD-10-CM | POA: Diagnosis not present

## 2020-10-10 DIAGNOSIS — Z1211 Encounter for screening for malignant neoplasm of colon: Secondary | ICD-10-CM

## 2020-10-10 HISTORY — PX: COLONOSCOPY: SHX5424

## 2020-10-10 SURGERY — COLONOSCOPY
Anesthesia: Moderate Sedation

## 2020-10-10 MED ORDER — MIDAZOLAM HCL 5 MG/5ML IJ SOLN
INTRAMUSCULAR | Status: AC
  Filled 2020-10-10: qty 10

## 2020-10-10 MED ORDER — ONDANSETRON HCL 4 MG/2ML IJ SOLN
INTRAMUSCULAR | Status: DC | PRN
  Administered 2020-10-10: 4 mg via INTRAVENOUS

## 2020-10-10 MED ORDER — MEPERIDINE HCL 100 MG/ML IJ SOLN
INTRAMUSCULAR | Status: DC | PRN
  Administered 2020-10-10: 15 mg
  Administered 2020-10-10: 10 mg
  Administered 2020-10-10: 25 mg

## 2020-10-10 MED ORDER — ONDANSETRON HCL 4 MG/2ML IJ SOLN
INTRAMUSCULAR | Status: AC
  Filled 2020-10-10: qty 2

## 2020-10-10 MED ORDER — MEPERIDINE HCL 50 MG/ML IJ SOLN
INTRAMUSCULAR | Status: AC
  Filled 2020-10-10: qty 1

## 2020-10-10 MED ORDER — STERILE WATER FOR IRRIGATION IR SOLN
Status: DC | PRN
  Administered 2020-10-10: 100 mL

## 2020-10-10 MED ORDER — MIDAZOLAM HCL 5 MG/5ML IJ SOLN
INTRAMUSCULAR | Status: DC | PRN
  Administered 2020-10-10: 2 mg via INTRAVENOUS
  Administered 2020-10-10 (×2): 1 mg via INTRAVENOUS
  Administered 2020-10-10: 2 mg via INTRAVENOUS
  Administered 2020-10-10 (×2): 1 mg via INTRAVENOUS

## 2020-10-10 MED ORDER — SODIUM CHLORIDE 0.9 % IV SOLN: INTRAVENOUS | Status: DC

## 2020-10-10 NOTE — Discharge Instructions (Addendum)
Diverticulosis  Diverticulosis is a condition that develops when small pouches (diverticula) form in the wall of the large intestine (colon). The colon is where water is absorbed and stool (feces) is formed. The pouches form when the inside layer of the colon pushes through weak spots in the outer layers of the colon. You may have a few pouches or many of them. The pouches usually do not cause problems unless they become inflamed or infected. When this happens, the condition is called diverticulitis. What are the causes? The cause of this condition is not known. What increases the risk? The following factors may make you more likely to develop this condition: Being older than age 45. Your risk for this condition increases with age. Diverticulosis is rare among people younger than age 42. By age 45, many people have it. Eating a low-fiber diet. Having frequent constipation. Being overweight. Not getting enough exercise. Smoking. Taking over-the-counter pain medicines, like aspirin and ibuprofen. Having a family history of diverticulosis. What are the signs or symptoms? In most people, there are no symptoms of this condition. If you do have symptoms, they may include: Bloating. Cramps in the abdomen. Constipation or diarrhea. Pain in the lower left side of the abdomen. How is this diagnosed? Because diverticulosis usually has no symptoms, it is most often diagnosed during an exam for other colon problems. The condition may be diagnosed by: Using a flexible scope to examine the colon (colonoscopy). Taking an X-ray of the colon after dye has been put into the colon (barium enema). Having a CT scan. How is this treated? You may not need treatment for this condition. Your health care provider may recommend treatment to prevent problems. You may need treatment if you have symptoms or if you previously had diverticulitis. Treatment may include: Eating a high-fiber diet. Taking a fiber  supplement. Taking a live bacteria supplement (probiotic). Taking medicine to relax your colon. Follow these instructions at home: Medicines Take over-the-counter and prescription medicines only as told by your health care provider. If told by your health care provider, take a fiber supplement or probiotic. Constipation prevention Your condition may cause constipation. To prevent or treat constipation, you may need to: Drink enough fluid to keep your urine pale yellow. Take over-the-counter or prescription medicines. Eat foods that are high in fiber, such as beans, whole grains, and fresh fruits and vegetables. Limit foods that are high in fat and processed sugars, such as fried or sweet foods.  General instructions Try not to strain when you have a bowel movement. Keep all follow-up visits as told by your health care provider. This is important. Contact a health care provider if you: Have pain in your abdomen. Have bloating. Have cramps. Have not had a bowel movement in 3 days. Get help right away if: Your pain gets worse. Your bloating becomes very bad. You have a fever or chills, and your symptoms suddenly get worse. You vomit. You have bowel movements that are bloody or black. You have bleeding from your rectum. Summary Diverticulosis is a condition that develops when small pouches (diverticula) form in the wall of the large intestine (colon). You may have a few pouches or many of them. This condition is most often diagnosed during an exam for other colon problems. Treatment may include increasing the fiber in your diet, taking supplements, or taking medicines. This information is not intended to replace advice given to you by your health care provider. Make sure you discuss any questions you have with  your health care provider. Document Revised: 05/17/2019 Document Reviewed: 05/17/2019 Elsevier Patient Education  2020 [employer]vier Inc[employer]  Colonoscopy Discharge  Instructions  Read the instructions outlined below and refer to this sheet in the next few weeks. These discharge instructions provide you with general information on caring for yourself after you leave the hospital. Your doctor may also give you specific instructions. While your treatment has been planned according to the most current medical practices available, unavoidable complications occasionally occur. If you have any problems or questions after discharge, call Dr. Jena Gauss at [phone]. ACTIVITY  You may resume your regular activity, but move at a slower pace for the next 24 hours.   Take frequent rest periods for the next 24 hours.   Walking will help get rid of the air and reduce the bloated feeling in your belly (abdomen).   No driving for 24 hours (because of the medicine (anesthesia) used during the test).    Do not sign any important legal documents or operate any machinery for 24 hours (because of the anesthesia used during the test).  NUTRITION  Drink plenty of fluids.   You may resume your normal diet as instructed by your doctor.   Begin with a light meal and progress to your normal diet. Heavy or fried foods are harder to digest and may make you feel sick to your stomach (nauseated).   Avoid alcoholic beverages for 24 hours or as instructed.  MEDICATIONS  You may resume your normal medications unless your doctor tells you otherwise.  WHAT YOU CAN EXPECT TODAY  Some feelings of bloating in the abdomen.   Passage of more gas than usual.   Spotting of blood in your stool or on the toilet paper.  IF YOU HAD POLYPS REMOVED DURING THE COLONOSCOPY:  No aspirin products for 7 days or as instructed.   No alcohol for 7 days or as instructed.   Eat a soft diet for the next 24 hours.  FINDING OUT THE RESULTS OF YOUR TEST Not all test results are available during your visit. If your test results are not back during the visit, make an appointment with your caregiver to find  out the results. Do not assume everything is normal if you have not heard from your caregiver or the medical facility. It is important for you to follow up on all of your test results.  SEEK IMMEDIATE MEDICAL ATTENTION IF:  You have more than a spotting of blood in your stool.   Your belly is swollen (abdominal distention).   You are nauseated or vomiting.   You have a temperature over 101.   You have abdominal pain or discomfort that is severe or gets worse throughout the day.   Diverticulosis information provided  No polyps found today  Try Imodium as needed for the occasional diarrhea that you experience.  I recommend a repeat colonoscopy for screening purposes in 10 years  At patient request, I called Rhesa Forsberg at [phone] results

## 2020-10-10 NOTE — Op Note (Signed)
[facility] Patient Name: Diane Wolf Procedure Date: 10/10/2020 6:39 AM MRN: [id number] Date of Birth: 02-22-1966 Attending MD: Gennette Pac , MD CSN: [id number] Age: 54 Admit Type: Outpatient Procedure:                Colonoscopy Indications:              Screening for colorectal malignant neoplasm Providers:                Gennette Pac, MD, Angelica Ran, Crystal Page,                            Dyann Ruddle Referring MD:              Medicines:                Midazolam 8 mg IV, Meperidine 50 mg IV Complications:            No immediate complications. Estimated Blood Loss:     Estimated blood loss: none. Procedure:                Pre-Anesthesia Assessment:                           - Prior to the procedure, a History and Physical                            was performed, and patient medications and                            allergies were reviewed. The patient's tolerance of                            previous anesthesia was also reviewed. The risks                            and benefits of the procedure and the sedation                            options and risks were discussed with the patient.                            All questions were answered, and informed consent                            was obtained. Prior Anticoagulants: The patient has                            taken no previous anticoagulant or antiplatelet                            agents. ASA Grade Assessment: II - A patient with                            mild systemic disease. After reviewing the risks  and benefits, the patient was deemed in                            satisfactory condition to undergo the procedure.                           After obtaining informed consent, the colonoscope                            was passed under direct vision. Throughout the                            procedure, the patient's blood pressure, pulse, and                             oxygen saturations were monitored continuously. The                            CF-[id number] ([id number]) scope was introduced through                            the anus and advanced to the the cecum, identified                            by appendiceal orifice and ileocecal valve. The                            colonoscopy was performed without difficulty. The                            patient tolerated the procedure well. The quality                            of the bowel preparation was adequate. Scope In: 7:55:52 AM Scope Out: 8:10:16 AM Scope Withdrawal Time: 0 hours 8 minutes 14 seconds  Total Procedure Duration: 0 hours 14 minutes 24 seconds  Findings:      The perianal and digital rectal examinations were normal.      Scattered small-mouthed diverticula were found in the entire colon.      The exam was otherwise without abnormality on direct and retroflexion       views. Impression:               - Diverticulosis in the entire examined colon.                           - The examination was otherwise normal on direct                            and retroflexion views.                           - No specimens collected. Moderate Sedation:      Moderate (conscious) sedation was administered by the endoscopy nurse       and supervised by the endoscopist. Total physician  intraservice time was       21 minutes. Recommendation:           - Patient has a contact number available for                            emergencies. The signs and symptoms of potential                            delayed complications were discussed with the                            patient. Return to normal activities tomorrow.                            Written discharge instructions were provided to the                            patient.                           - Advance diet as tolerated.                           - Repeat colonoscopy in 10 years for screening                            purposes.                            - Return to GI clinic (date not yet determined). Procedure Code(s):        --- Professional ---                           [phone], Colonoscopy, flexible; diagnostic, including                            collection of specimen(s) by brushing or washing,                            when performed (separate procedure)                           G0500, Moderate sedation services provided by the                            same physician or other qualified health care                            professional performing a gastrointestinal                            endoscopic service that sedation supports,                            requiring the presence of an independent trained  observer to assist in the monitoring of the                            patient's level of consciousness and physiological                            status; initial 15 minutes of intra-service time;                            patient age 69 years or older (additional time may                            be reported with [id number], as appropriate) Diagnosis Code(s):        --- Professional ---                           Z12.11, Encounter for screening for malignant                            neoplasm of colon                           K57.30, Diverticulosis of large intestine without                            perforation or abscess without bleeding CPT copyright 2019 [employer]. All rights reserved. The codes documented in this report are preliminary and upon coder review may  be revised to meet current compliance requirements. Gerrit Friends. Kaleeya Hancock, MD Gennette Pac, MD 10/10/2020 9:25:05 AM This report has been signed electronically. Number of Addenda: 0

## 2020-10-10 NOTE — H&P (Signed)
@LOGO @   Primary Care Physician:  , MD Primary Gastroenterologist:  Dr. Juliette Alcide  Pre-Procedure History & Physical: HPI:  Diane Wolf is a 54 y.o. female is here for a screening colonoscopy.  First ever screening examination.  No bowel symptoms aside from occasional diarrhea. No first-degree relatives with colon cancer.  Past Medical History:  Diagnosis Date  . Gestational diabetes   . History of cholelithiasis     Past Surgical History:  Procedure Laterality Date  . CHOLECYSTECTOMY      Prior to Admission medications   Medication Sig Start Date End Date Taking? Authorizing Provider  Ascorbic Acid (VITAMIN C PO) Take 1 tablet by mouth daily.   Yes [provider]  Cholecalciferol (VITAMIN D) 50 MCG (2000 UT) tablet Take 2,000 Units by mouth daily.   Yes [provider]  cholestyramine (QUESTRAN) 4 g packet Take 1 packet (4 g total) by mouth 3 (three) times daily with meals. 08/26/20   08/28/20, NP  ibuprofen (ADVIL) 200 MG tablet Take 400 mg by mouth every 8 (eight) hours as needed (pain.).    [provider]    Allergies as of 08/26/2020 - Review Complete 08/26/2020  Allergen Reaction Noted  . Contrast media [iodinated diagnostic agents]  10/20/2014    Family History  Problem Relation Age of Onset  . Colon cancer Paternal Grandmother   . Breast cancer Neg Hx     Social History   Socioeconomic History  . Marital status: Married    Spouse name: Not on file  . Number of children: Not on file  . Years of education: Not on file  . Highest education level: Not on file  Occupational History  . Not on file  Tobacco Use  . Smoking status: Never Smoker  . Smokeless tobacco: Never Used  Substance and Sexual Activity  . Alcohol use: Yes    Comment: 1-2 drinks on vacation  . Drug use: No  . Sexual activity: Not on file  Other Topics Concern  . Not on file  Social History Narrative  . Not on file   Social Determinants of  Health   Financial Resource Strain: Not on file  Food Insecurity: Not on file  Transportation Needs: Not on file  Physical Activity: Not on file  Stress: Not on file  Social Connections: Not on file  Intimate Partner Violence: Not on file    Review of Systems: See HPI, otherwise negative ROS  Physical Exam: BP (!) 152/89   Pulse 76   Temp 98.7 F (37.1 C) (Oral)   Resp 13   Ht 5\' 5"  (1.651 m)   Wt 73 kg   LMP 01/11/2011   SpO2 100%   BMI 26.79 kg/m  General:   Alert,  Well-developed, well-nourished, pleasant and cooperative in NAD Neck:  Supple; no masses or thyromegaly. Lungs:  Clear throughout to auscultation.   No wheezes, crackles, or rhonchi. No acute distress. Heart:  Regular rate and rhythm; no murmurs, clicks, rubs,  or gallops. Abdomen:  Soft, nontender and nondistended. No masses, hepatosplenomegaly or hernias noted. Normal bowel sounds, without guarding, and without rebound.    Impression/Plan: Diane Wolf is now here to undergo a screening colonoscopy.  Risks, benefits, limitations, imponderables and alternatives regarding colonoscopy have been reviewed with the patient. Questions have been answered. All parties agreeable.     Notice:  This dictation was prepared with Dragon dictation along with smaller phrase technology. Any transcriptional errors that  result from this process are unintentional and may not be corrected upon review.

## 2020-10-16 ENCOUNTER — Encounter (HOSPITAL_COMMUNITY): Payer: Self-pay | Admitting: Internal Medicine

## 2020-10-28 ENCOUNTER — Ambulatory Visit: Payer: No Typology Code available for payment source | Admitting: Nurse Practitioner

## 2020-11-28 ENCOUNTER — Other Ambulatory Visit: Payer: Self-pay | Admitting: Obstetrics and Gynecology

## 2020-11-28 DIAGNOSIS — Z1231 Encounter for screening mammogram for malignant neoplasm of breast: Secondary | ICD-10-CM

## 2020-12-10 ENCOUNTER — Ambulatory Visit: Payer: 59 | Admitting: Nurse Practitioner

## 2020-12-23 ENCOUNTER — Ambulatory Visit
Admission: RE | Admit: 2020-12-23 | Discharge: 2020-12-23 | Disposition: A | Discharge status: Home / Self Care | Payer: 59 | Source: Ambulatory Visit

## 2020-12-23 ENCOUNTER — Other Ambulatory Visit: Payer: Self-pay

## 2020-12-23 DIAGNOSIS — Z1231 Encounter for screening mammogram for malignant neoplasm of breast: Secondary | ICD-10-CM

## 2022-01-14 ENCOUNTER — Other Ambulatory Visit: Payer: Self-pay | Admitting: Obstetrics and Gynecology

## 2022-01-20 ENCOUNTER — Ambulatory Visit
Admission: RE | Admit: 2022-01-20 | Discharge: 2022-01-20 | Disposition: A | Discharge status: Home / Self Care | Payer: 59 | Source: Ambulatory Visit

## 2022-08-20 IMAGING — MG MM DIGITAL SCREENING BILAT W/ TOMO AND CAD
8 series · 9 of 24 positions shown · non-contrast
Comparison: Previous exam(s).

CLINICAL DATA: Screening.

EXAM:
DIGITAL SCREENING BILATERAL MAMMOGRAM WITH TOMOSYNTHESIS AND CAD
TECHNIQUE: Bilateral screening digital craniocaudal and mediolateral oblique
mammograms were obtained. Bilateral screening digital breast
tomosynthesis was performed. The images were evaluated with
computer-aided detection.

[R CC synth-2D]
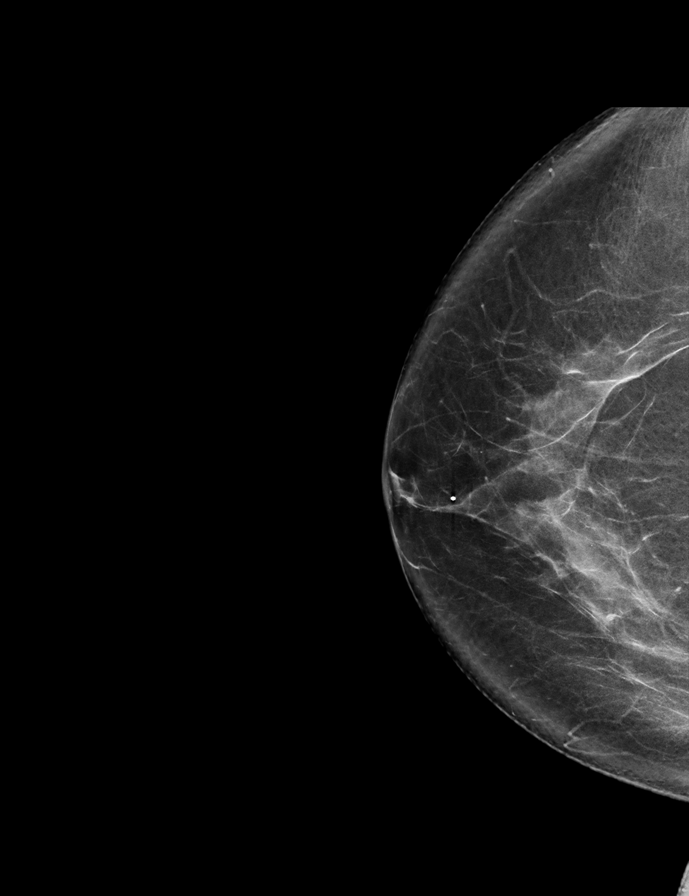

[R MLO synth-2D]
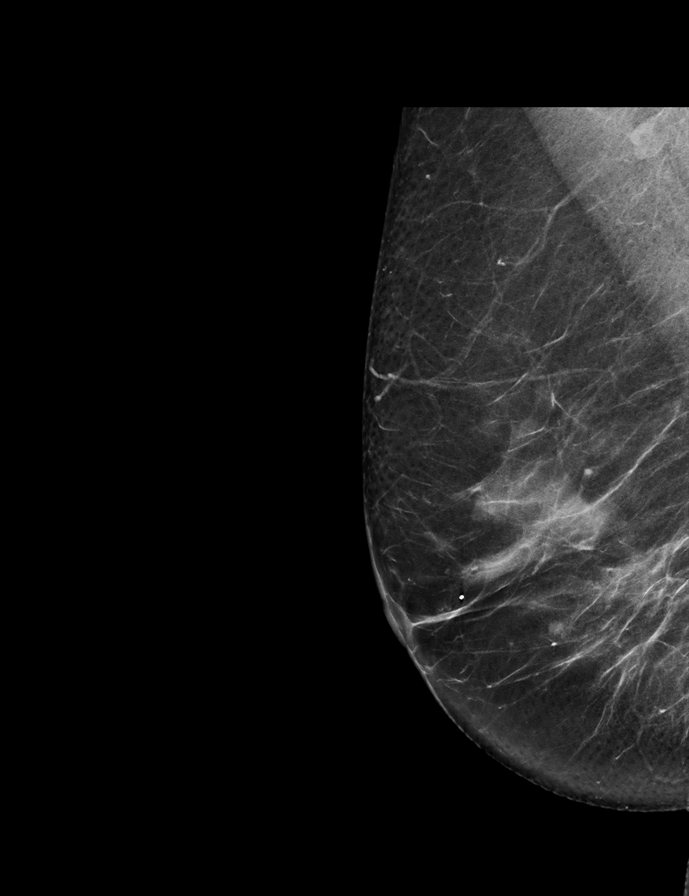

[L CC synth-2D]
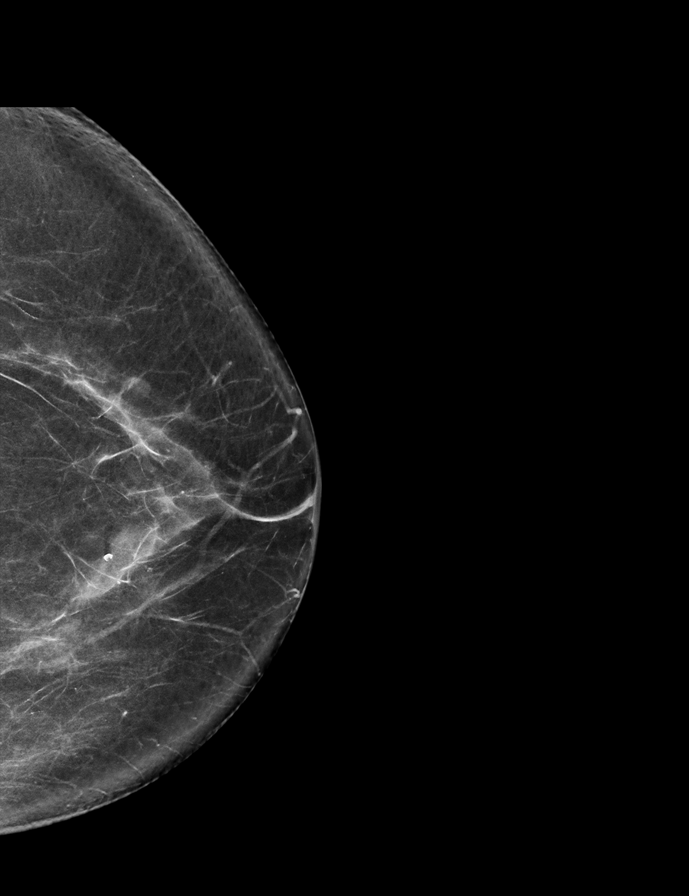

[L MLO synth-2D]
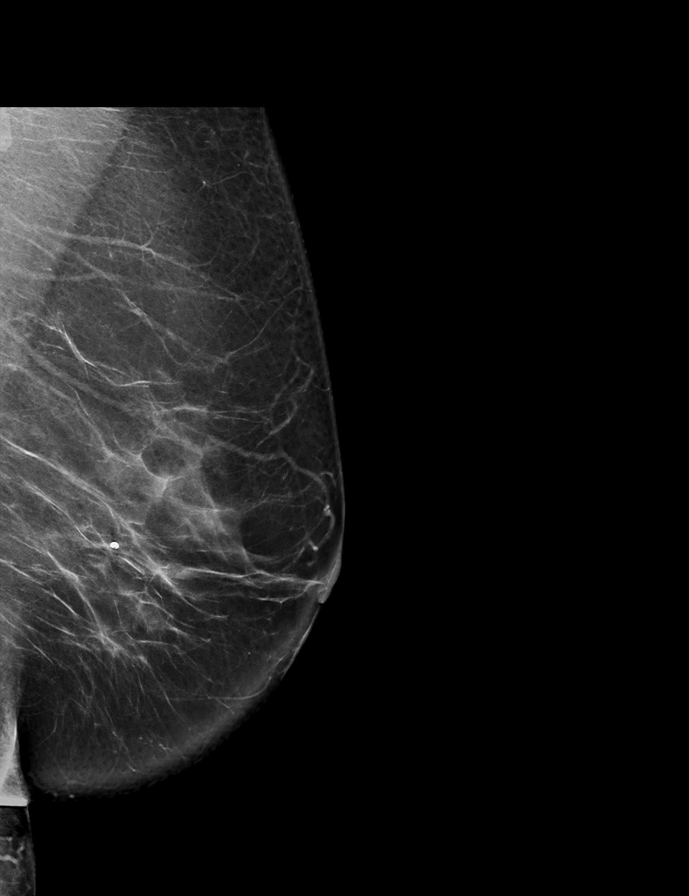

[L MLO tomo · 2 of 77 frames shown]
[frame 25/77]
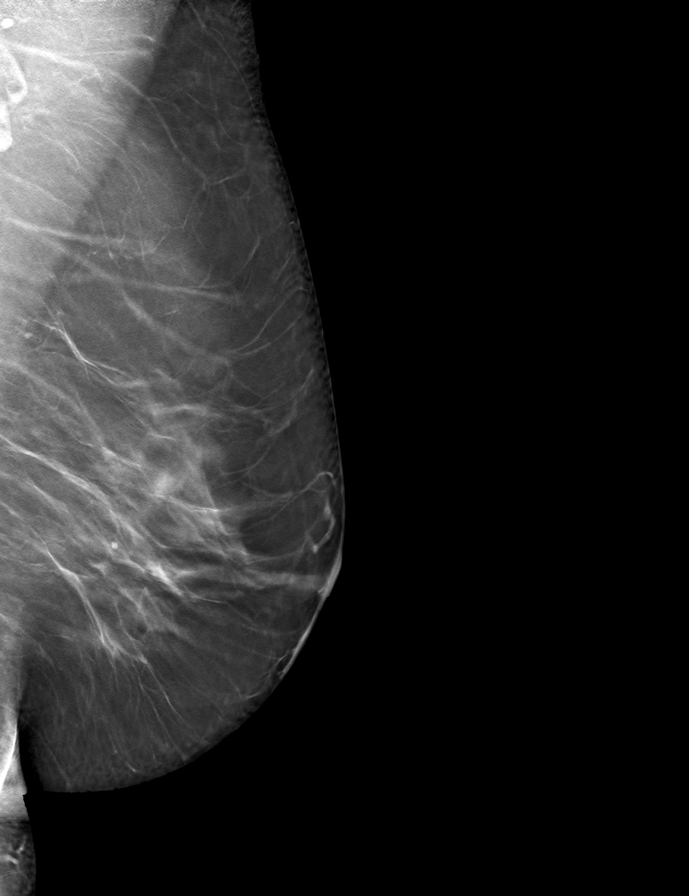
[frame 39/77]
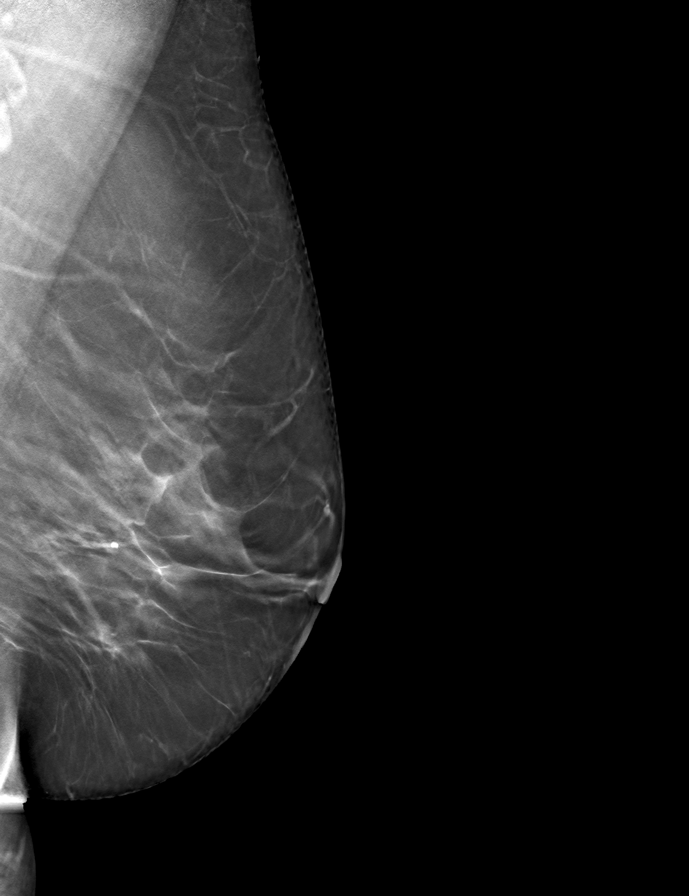

[L CC tomo · tomo slice 34/67.0]
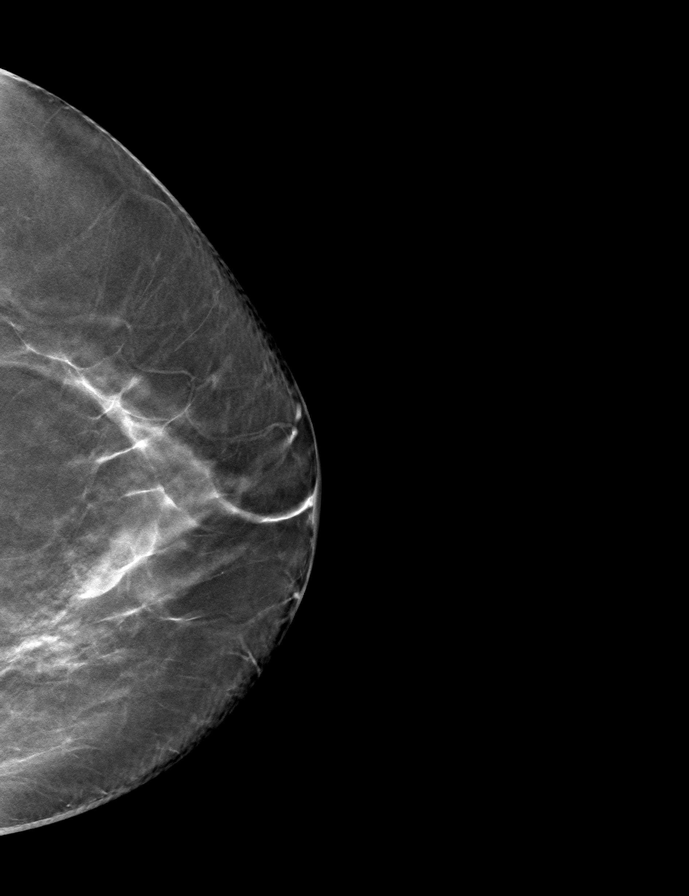

[R CC tomo · tomo slice 37/72.0]
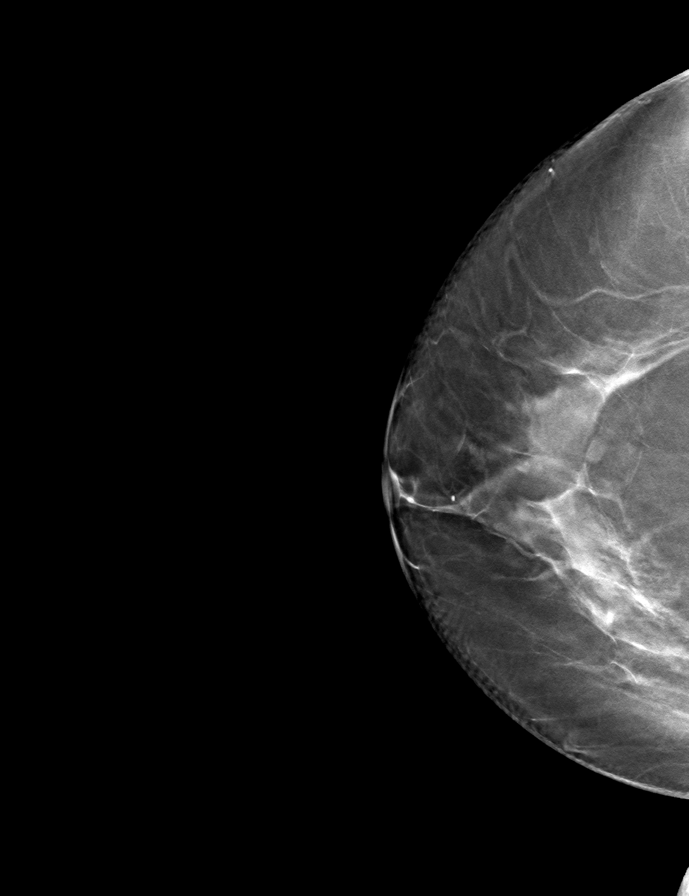

[R MLO tomo · tomo slice 35/68.0]
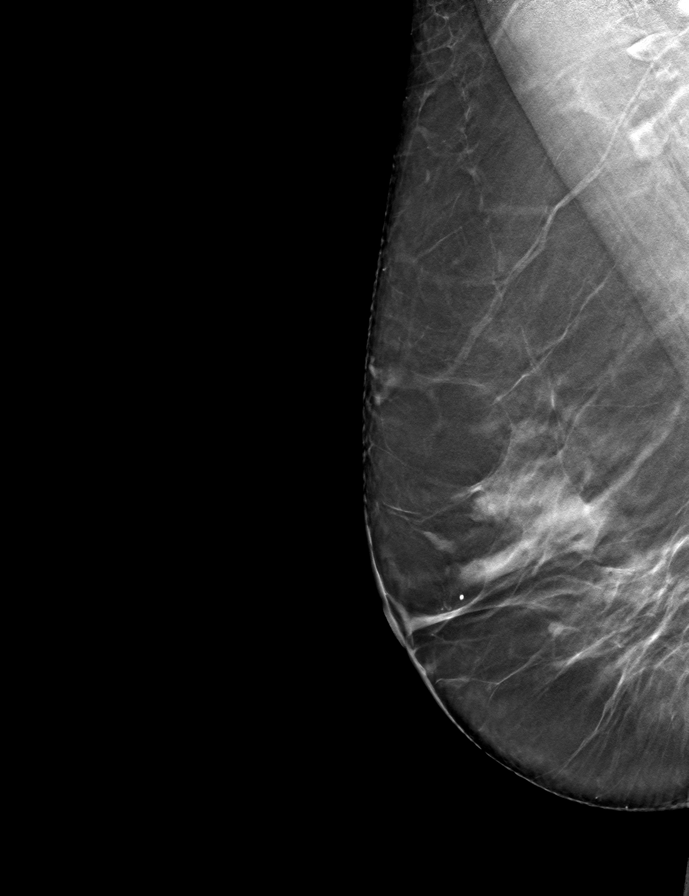

[9 of 24 positions shown; findings below may reference images not displayed]

ACR Breast Density Category c: The breast tissue is heterogeneously
dense, which may obscure small masses.
FINDINGS: There are no findings suspicious for malignancy.
IMPRESSION: No mammographic evidence of malignancy. A result letter of this
screening mammogram will be mailed directly to the patient.

RECOMMENDATION:
Screening mammogram in one year. (Code:Q3-W-BC3)

BI-RADS CATEGORY  1: Negative.

## 2023-02-04 ENCOUNTER — Other Ambulatory Visit: Payer: Self-pay | Admitting: Obstetrics and Gynecology

## 2023-02-04 DIAGNOSIS — Z1231 Encounter for screening mammogram for malignant neoplasm of breast: Secondary | ICD-10-CM

## 2023-02-21 ENCOUNTER — Ambulatory Visit: Payer: 59

## 2023-05-02 ENCOUNTER — Ambulatory Visit
Admission: RE | Admit: 2023-05-02 | Discharge: 2023-05-02 | Disposition: A | Discharge status: Home / Self Care | Payer: 59 | Source: Ambulatory Visit | Attending: Obstetrics and Gynecology | Admitting: Obstetrics and Gynecology

## 2023-05-02 DIAGNOSIS — Z1231 Encounter for screening mammogram for malignant neoplasm of breast: Secondary | ICD-10-CM

## 2024-08-16 ENCOUNTER — Other Ambulatory Visit: Payer: Self-pay | Admitting: Obstetrics and Gynecology

## 2024-08-16 DIAGNOSIS — Z1231 Encounter for screening mammogram for malignant neoplasm of breast: Secondary | ICD-10-CM

## 2024-09-11 ENCOUNTER — Ambulatory Visit

## 2024-10-09 ENCOUNTER — Ambulatory Visit

## 2024-11-13 ENCOUNTER — Ambulatory Visit
Admission: RE | Admit: 2024-11-13 | Discharge: 2024-11-13 | Disposition: A | Source: Ambulatory Visit | Attending: Obstetrics and Gynecology | Admitting: Obstetrics and Gynecology

## 2024-11-13 DIAGNOSIS — Z1231 Encounter for screening mammogram for malignant neoplasm of breast: Secondary | ICD-10-CM
